# Patient Record
Sex: Male | Born: 1965 | Race: White | Hispanic: No | Marital: Single | Smoking: Former smoker
Health system: Southern US, Community
[De-identification: ages and names within clinical notes are randomized; demographics above are authoritative.]

## PROBLEM LIST (undated history)

## (undated) DIAGNOSIS — E119 Type 2 diabetes mellitus without complications: Secondary | ICD-10-CM

## (undated) HISTORY — DX: Type 2 diabetes mellitus without complications: E11.9

---

## 2006-07-17 ENCOUNTER — Emergency Department (HOSPITAL_COMMUNITY): Admission: EM | Admit: 2006-07-17 | Discharge: 2006-07-17 | Payer: Self-pay | Admitting: Family Medicine

## 2013-05-29 ENCOUNTER — Ambulatory Visit (INDEPENDENT_AMBULATORY_CARE_PROVIDER_SITE_OTHER): Payer: BC Managed Care – PPO | Admitting: Family Medicine

## 2013-05-29 VITALS — BP 130/86 | HR 88 | Temp 98.4°F | Resp 18 | Ht 65.5 in | Wt 182.0 lb

## 2013-05-29 DIAGNOSIS — H9319 Tinnitus, unspecified ear: Secondary | ICD-10-CM

## 2013-05-29 DIAGNOSIS — E119 Type 2 diabetes mellitus without complications: Secondary | ICD-10-CM

## 2013-05-29 LAB — GLUCOSE, POCT (MANUAL RESULT ENTRY): POC Glucose: 365 mg/dl — AB (ref 70–99)

## 2013-05-29 MED ORDER — METFORMIN HCL 1000 MG PO TABS: 1000.0000 mg | ORAL_TABLET | Freq: Two times a day (BID) | ORAL | Status: DC

## 2013-05-29 MED ORDER — GLIPIZIDE 5 MG PO TABS: 5.0000 mg | ORAL_TABLET | Freq: Two times a day (BID) | ORAL | Status: DC

## 2013-05-29 NOTE — Progress Notes (Signed)
Subjective:    Patient ID: Clayton Farrell, male    DOB: [birth year], 48 y.o.   MRN: [id number]  HPI Primary Physician: No primary provider on file.  Chief Complaint: Tinnitus right ear x 2 days  HPI: 48 y.o. male with history below presents with 2 day history of high pitched tinnitus in the right ear. Tinnitus is constant and does not pulsate. He states he is not sure or not if he feels like there is any associated right sided facial numbness and right sided headache. When I continue to ask him about this he states he cannot tell. He further states the numbness may be localized to the right ear as he has been pulling at the right ear for the past two days trying to get the ringing to stop. He has been placing hot water, hydrogen peroxide, and OTC cerumen remover into his ear canal without much success. 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 He does not use hearing protection when using this equipment. 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 No associated dizziness or vertigo symptoms. No congestion. Afebrile. No otalgia.    When I ask if his DM is controlled he answers with "yes, but not really." When I ask if he checks his blood sugars at home he states, "sometimes, but not really." Last time he checked he states they were "like 180." This was "like 6 months ago." His PCP is "different people." This list includes [facility] and a clinic on [address].      Past Medical History  Diagnosis Date  . Diabetes mellitus without complication      Home Meds: Prior to Admission medications   Medication Sig Start Date End Date Taking? Authorizing Provider  glipiZIDE (GLUCOTROL XL) 2.5 MG 24 hr tablet Take 2.5 mg by mouth daily with breakfast.   Yes Historical Provider, MD  metFORMIN (GLUCOPHAGE) 850 MG tablet Take 850 mg by mouth 2 (two) times daily with a meal.   Yes Historical Provider, MD    Allergies: No Known  Allergies  History   Social History  . Marital Status: Single    Spouse Name: N/A    Number of Children: N/A  . Years of Education: N/A   Occupational History  . Not on file.   Social History Main Topics  . Smoking status: Never Smoker   . Smokeless tobacco: Not on file  . Alcohol Use: No  . Drug Use: No  . Sexual Activity: Not on file   Other Topics Concern  . Not on file   Social History Narrative  . No narrative on file     Review of Systems  HENT: Positive for tinnitus. Negative for ear pain and hearing loss.        High pitched tinnitus.   Neurological: Positive for numbness and headaches. Negative for dizziness, speech difficulty and weakness.       Mild right sided HA.  He says he is not sure if the right side of his face his numb.        Objective:   Physical Exam  Physical Exam: Blood pressure 130/86, pulse 88, temperature 98.4 F (36.9 C), temperature source Oral, resp. rate 18, height 5' 5.5" (1.664 m), weight 182 lb (82.555 kg), SpO2 98.00%., Body mass index is 29.82 kg/(m^2). General: Well developed, well nourished, in no acute distress. Head: Normocephalic, atraumatic, eyes without discharge, sclera non-icteric, nares  are without discharge. Bilateral auditory canals clear, TM's are without perforation, pearly grey and translucent with reflective cone of light bilaterally. No pinna, tragus, or mastoid TTP bilaterally. Oral cavity moist, posterior pharynx without exudate, erythema, peritonsillar abscess, or post nasal drip. Uvula midline.   Neck: Supple. No thyromegaly. Full ROM. No lymphadenopathy. No nuchal rigidity. No carotid bruits bilaterally.  Lungs: Clear bilaterally to auscultation without wheezes, rales, or rhonchi. Breathing is unlabored. Heart: RRR with S1 S2. No murmurs, rubs, or gallops appreciated. Msk:  Strength and tone normal for age. Extremities/Skin: Warm and dry. No clubbing or cyanosis. No edema. No rashes or suspicious  lesions. Neuro: Alert and oriented X 3. Moves all extremities spontaneously. Gait is normal. CNII-XII grossly in tact. DTR: 1+ UE bilaterally and 0 LE bilaterally. Normal cerebellar function. Normal Rhomberg. Sensation equal along bilateral sides.  Psych:  Responds to questions appropriately with a normal affect.    Labs: Results for orders placed in visit on 05/29/13  GLUCOSE, POCT (MANUAL RESULT ENTRY)      Result Value Ref Range   POC Glucose 365 (*) 70 - 99 mg/dl       Assessment & Plan:  48 year old male with unilateral tinnitus and diabetes mellitus  1) Unilateral tinnitus -Nonfocal neuro exam -Trend blood sugars down -Recheck 24 hours -Plan for CT/MRI first of the week, sooner if needed -ER precautions given  2) Diabetes mellitus  -Stop prior DM meds -Metformin 1000 mg bid #60 no RF -Glipizide 5 mg bid #60 no RF -Healthy diet and exercise -Discussed risks of uncontrolled DM including death  Discussed with Dr. Serita GrammesGreene   Nimo Verastegui, MHS, PA-C [facility] [address] [place], [place] [address] [phone] [facility] 05/29/2013 5:15 PM

## 2013-05-30 ENCOUNTER — Ambulatory Visit (INDEPENDENT_AMBULATORY_CARE_PROVIDER_SITE_OTHER): Payer: BC Managed Care – PPO | Admitting: Family Medicine

## 2013-05-30 VITALS — BP 122/80 | HR 100 | Temp 97.6°F | Resp 15 | Ht 65.5 in | Wt 180.0 lb

## 2013-05-30 DIAGNOSIS — IMO0002 Reserved for concepts with insufficient information to code with codable children: Secondary | ICD-10-CM

## 2013-05-30 DIAGNOSIS — E1165 Type 2 diabetes mellitus with hyperglycemia: Secondary | ICD-10-CM | POA: Insufficient documentation

## 2013-05-30 DIAGNOSIS — H9319 Tinnitus, unspecified ear: Secondary | ICD-10-CM

## 2013-05-30 DIAGNOSIS — E118 Type 2 diabetes mellitus with unspecified complications: Principal | ICD-10-CM

## 2013-05-30 DIAGNOSIS — E119 Type 2 diabetes mellitus without complications: Secondary | ICD-10-CM | POA: Insufficient documentation

## 2013-05-30 LAB — POCT SEDIMENTATION RATE: POCT SED RATE: 7 mm/h (ref 0–22)

## 2013-05-30 LAB — POCT GLYCOSYLATED HEMOGLOBIN (HGB A1C): Hemoglobin A1C: 10

## 2013-05-30 LAB — GLUCOSE, POCT (MANUAL RESULT ENTRY): POC Glucose: 225 mg/dl — AB (ref 70–99)

## 2013-05-30 MED ORDER — GLYBURIDE 5 MG PO TABS: ORAL_TABLET | ORAL | Status: DC

## 2013-05-30 NOTE — Progress Notes (Signed)
Patient discussed with Eula Listenyan Dunn, PA-C. Agree with assessment and plan of care per his note.

## 2013-05-30 NOTE — Patient Instructions (Signed)
Increase to 5mg  of glyburide- take 2 of the 2.5 mg pills until you finish these.  Then you can go to the 5mg  tablet one a day.  We may increase to 10 mg in the future.  Please work on your diet and consume less sugar. Please come and see us in one month for a recheck.

## 2013-05-30 NOTE — Progress Notes (Addendum)
[facility] [address], [place] [address] [phone]  Date:  05/30/2013   Name:  Clayton Farrell   DOB:  May 14, 1965   MRN:  [id number]  PCP:  No primary provider on file.    Chief Complaint: Medication question, Tinnitus and Follow-up   History of Present Illness:  Clayton Farrell is a 48 y.o. very pleasant male patient who presents with the following:  Here today to recheck his uncontrolled diabetes.  He was seen yesterday with tinnitus in the right ear.  Was noted to have ben exposed to loud machinery recently for his job.  He was unclear on his current medication doses yesterday- we had planned to increase his metformin and start glipizide.  However it turns out he had been on glyburide 2.5 and had a full bottle of this at home.  He wonders if he can increase the dose of this instead of switching to glipizide Admits he has not been very compliant with his diet recently and has been eating a lot of sugar.  He plans to work on this.    There are no active problems to display for this patient.   Past Medical History  Diagnosis Date  . Diabetes mellitus without complication     History reviewed. No pertinent past surgical history.  History  Substance Use Topics  . Smoking status: Former Smoker    Quit date: 01/31/2008  . Smokeless tobacco: Not on file  . Alcohol Use: No    Family History  Problem Relation Age of Onset  . Diabetes Mother   . Diabetes Sister   . Diabetes Brother     No Known Allergies  Medication list has been reviewed and updated.  Current Outpatient Prescriptions on File Prior to Visit  Medication Sig Dispense Refill  . metFORMIN (GLUCOPHAGE) 1000 MG tablet Take 1 tablet (1,000 mg total) by mouth 2 (two) times daily with a meal.  60 tablet  0  . glipiZIDE (GLUCOTROL) 5 MG tablet Take 1 tablet (5 mg total) by mouth 2 (two) times daily before a meal.  60 tablet  0   No current facility-administered medications on file  prior to visit.    Review of Systems:  As per HPI- otherwise negative.   Physical Examination: Filed Vitals:   05/30/13 1507  BP: 122/80  Pulse: 100  Temp: 97.6 F (36.4 C)  Resp: 15   Filed Vitals:   05/30/13 1507  Height: 5' 5.5" (1.664 m)  Weight: 180 lb (81.647 kg)   Body mass index is 29.49 kg/(m^2). Ideal Body Weight: Weight in (lb) to have BMI = 25: 152.2  GEN: WDWN, NAD, Non-toxic, A & O x 3, looks well, overweight HEENT: Atraumatic, Normocephalic. Neck supple. No masses, No LAD.  Bilateral TM wnl, oropharynx normal.  PEERL,EOMI.   Ears and Nose: No external deformity. CV: RRR, No M/G/R. No JVD. No thrill. No extra heart sounds. PULM: CTA B, no wheezes, crackles, rhonchi. No retractions. No resp. distress. No accessory muscle use. ABD: S, NT, ND, +BS. No rebound. No HSM. EXTR: No c/c/e NEURO Normal gait.  PSYCH: Normally interactive. Conversant. Not depressed or anxious appearing.  Calm demeanor.   Results for orders placed in visit on 05/30/13  GLUCOSE, POCT (MANUAL RESULT ENTRY)      Result Value Ref Range   POC Glucose 225 (*) 70 - 99 mg/dl  POCT GLYCOSYLATED HEMOGLOBIN (HGB A1C)      Result Value Ref Range  Hemoglobin A1C 10      Assessment and Plan: Type II or unspecified type diabetes mellitus with unspecified complication, uncontrolled - Plan: POCT glucose (manual entry), POCT glycosylated hemoglobin (Hb A1C), Basic metabolic panel, glyBURIDE (DIABETA) 5 MG tablet  Tinnitus  Discussed his DM with him.  His A1c shows that he is not under good control. He can stick with glyburide as he already has this available- will go up to 5mg  and may go up further as needed.  He will work on his diet and try to reduce sugars. Plan recheck in 1 month.  He is concerned about his tinnitus- will check sed rate and BMP for him today, follow-up with las.  Plan ENT referral if this continues   Signed Abbe AmsterdamJessica Copland, MD  Called him back on 5/20: he states he already  went to see ENT and was told that perhaps his blood sugar was causing his tinnitus.  Agree that this is possible, and he will come and see us in a month to recheck.

## 2013-05-31 LAB — BASIC METABOLIC PANEL
BUN: 10 mg/dL (ref 6–23)
CO2: 22 meq/L (ref 19–32)
Calcium: 10.2 mg/dL (ref 8.4–10.5)
Chloride: 104 mEq/L (ref 96–112)
Creat: 0.78 mg/dL (ref 0.50–1.35)
GLUCOSE: 217 mg/dL — AB (ref 70–99)
Potassium: 4 mEq/L (ref 3.5–5.3)
Sodium: 138 mEq/L (ref 135–145)

## 2013-06-02 ENCOUNTER — Encounter: Payer: Self-pay | Admitting: Family Medicine

## 2013-06-25 ENCOUNTER — Ambulatory Visit (INDEPENDENT_AMBULATORY_CARE_PROVIDER_SITE_OTHER): Payer: BC Managed Care – PPO | Admitting: Family Medicine

## 2013-06-25 VITALS — BP 108/82 | HR 85 | Temp 98.3°F | Resp 16 | Ht 66.25 in | Wt 172.2 lb

## 2013-06-25 DIAGNOSIS — Z23 Encounter for immunization: Secondary | ICD-10-CM

## 2013-06-25 DIAGNOSIS — J3489 Other specified disorders of nose and nasal sinuses: Secondary | ICD-10-CM

## 2013-06-25 DIAGNOSIS — E119 Type 2 diabetes mellitus without complications: Secondary | ICD-10-CM

## 2013-06-25 DIAGNOSIS — E118 Type 2 diabetes mellitus with unspecified complications: Principal | ICD-10-CM

## 2013-06-25 DIAGNOSIS — R0981 Nasal congestion: Secondary | ICD-10-CM

## 2013-06-25 DIAGNOSIS — E1165 Type 2 diabetes mellitus with hyperglycemia: Secondary | ICD-10-CM

## 2013-06-25 DIAGNOSIS — IMO0002 Reserved for concepts with insufficient information to code with codable children: Secondary | ICD-10-CM

## 2013-06-25 LAB — GLUCOSE, POCT (MANUAL RESULT ENTRY): POC Glucose: 114 mg/dl — AB (ref 70–99)

## 2013-06-25 LAB — MICROALBUMIN, URINE: Microalb, Ur: 1.75 mg/dL (ref 0.00–1.89)

## 2013-06-25 MED ORDER — FLUTICASONE PROPIONATE 50 MCG/ACT NA SUSP: 2.0000 | Freq: Every day | NASAL | Status: DC

## 2013-06-25 MED ORDER — METFORMIN HCL 1000 MG PO TABS
1000.0000 mg | ORAL_TABLET | Freq: Two times a day (BID) | ORAL | Status: DC
Start: 2013-06-25 — End: 2015-03-20

## 2013-06-25 NOTE — Patient Instructions (Signed)
I will be in touch with your labs.  Your blood pressure looks good. Please come and see us in about 2 months to follow-up your diabetes.  Your blood sugar looks ok today as well.   Try the flonase for your nasal congestion For the time being stick with a 1/2 tablet of your glyburide medication  Office Visit on 05/30/2013  Component Date Value Ref Range Status  . POC Glucose 05/30/2013 225* 70 - 99 mg/dl Final  . Hemoglobin [id number] 05/30/2013 10   Final  . Sodium 05/30/2013 138  135 - 145 mEq/L Final  . Potassium 05/30/2013 4.0  3.5 - 5.3 mEq/L Final  . Chloride 05/30/2013 104  96 - 112 mEq/L Final  . CO2 05/30/2013 22  19 - 32 mEq/L Final  . Glucose, Bld 05/30/2013 217* 70 - 99 mg/dL Final  . BUN 09/60/454005/17/2015 10  6 - 23 mg/dL Final  . Creat 98/11/914705/17/2015 0.78  0.50 - 1.35 mg/dL Final  . Calcium 82/95/621305/17/2015 10.2  8.4 - 10.5 mg/dL Final

## 2013-06-25 NOTE — Progress Notes (Signed)
[facility] [address], [place] [address] [phone]  Date:  06/25/2013   Name:  Clayton ReddishJuan T Farrell   DOB:  07/06/1965   MRN:  [id number]  PCP:  No PCP Per Patient    Chief Complaint: Follow-up   History of Present Illness:  Clayton Farrell is a 48 y.o. very pleasant male patient who presents with the following:  Here today to recheck DM.  Seen here last month and was started on therapy for his DM which was undertreated at that time.  Noted to have an A1c of 10%. He is fasting today.  He has been checking his glucose at home- running 80-90s in the morning generally.  When he tried to take 5mg  of glyburide however he might get into he 60s/ 70s.   He has been watching his diet.   He has been taking metformin 1000 BID and glyburide 2.5 mg.   He still has the tinnitus- he did see ENT, but was told it was nothing dangerous but there is no likely treatment for him.  He is trying to use white noise for distraction.  His employer has changed his insurance and he will likely not have insurance by the end of this month. He does not think he will be able to pay for the new plan   Patient Active Problem List   Diagnosis Date Noted  . Type II or unspecified type diabetes mellitus with unspecified complication, uncontrolled 05/30/2013    Past Medical History  Diagnosis Date  . Diabetes mellitus without complication     No past surgical history on file.  History  Substance Use Topics  . Smoking status: Former Smoker    Quit date: 01/31/2008  . Smokeless tobacco: Not on file  . Alcohol Use: No    Family History  Problem Relation Age of Onset  . Diabetes Mother   . Diabetes Sister   . Diabetes Brother     No Known Allergies  Medication list has been reviewed and updated.  Current Outpatient Prescriptions on File Prior to Visit  Medication Sig Dispense Refill  . glyBURIDE (DIABETA) 5 MG tablet Take 1 daily. Increase as directed  60 tablet  4  .  metFORMIN (GLUCOPHAGE) 1000 MG tablet Take 1 tablet (1,000 mg total) by mouth 2 (two) times daily with a meal.  60 tablet  0   No current facility-administered medications on file prior to visit.    Review of Systems:  As per HPI- otherwise negative.   Physical Examination: Filed Vitals:   06/25/13 0924  BP: 108/82  Pulse: 85  Temp: 98.3 F (36.8 C)  Resp: 16   Filed Vitals:   06/25/13 0924  Height: 5' 6.25" (1.683 m)  Weight: 172 lb 3.2 oz (78.109 kg)   Body mass index is 27.58 kg/(m^2). Ideal Body Weight: Weight in (lb) to have BMI = 25: 155.7b  GEN: WDWN, NAD, Non-toxic, A & O x 3, looks well HEENT: Atraumatic, Normocephalic. Neck supple. No masses, No LAD. Ears and Nose: No external deformity. CV: RRR, No M/G/R. No JVD. No thrill. No extra heart sounds. PULM: CTA B, no wheezes, crackles, rhonchi. No retractions. No resp. distress. No accessory muscle use. EXTR: No c/c/e NEURO Normal gait.  PSYCH: Normally interactive. Conversant. Not depressed or anxious appearing.  Calm demeanor.  Foot exam today, normal  Results for orders placed in visit on 06/25/13  GLUCOSE, POCT (MANUAL RESULT ENTRY)  Result Value Ref Range   POC Glucose 114 (*) 70 - 99 mg/dl    Assessment and Plan: Type II or unspecified type diabetes mellitus with unspecified complication, uncontrolled - Plan: POCT glucose (manual entry), Comprehensive metabolic panel, Lipid panel, Microalbumin, urine, Pneumococcal polysaccharide vaccine 23-valent greater than or equal to 2yo subcutaneous/IM  Type II or unspecified type diabetes mellitus without mention of complication, not stated as uncontrolled - Plan: metFORMIN (GLUCOPHAGE) 1000 MG tablet  Nasal congestion - Plan: fluticasone (FLONASE) 50 MCG/ACT nasal spray  Encouraged him to see the eye doctor this month prior to his insurance running out. Will have him come back in 2 months.   Try flonase for his nasal sx He will stick with 2.5 mg of glyburide  for now as he seemed to have some lows with 5mg .    Signed Abbe AmsterdamJessica Borghild Thaker, MD

## 2013-06-26 ENCOUNTER — Encounter: Payer: Self-pay | Admitting: Family Medicine

## 2013-06-26 LAB — LIPID PANEL
Cholesterol: 118 mg/dL (ref 0–200)
HDL: 40 mg/dL (ref >39)
LDL Cholesterol: 68 mg/dL (ref 0–99)
TRIGLYCERIDES: 49 mg/dL (ref <150)
Total CHOL/HDL Ratio: 3 Ratio
VLDL: 10 mg/dL (ref 0–40)

## 2013-06-26 LAB — COMPREHENSIVE METABOLIC PANEL
ALBUMIN: 4.9 g/dL (ref 3.5–5.2)
ALK PHOS: 55 U/L (ref 39–117)
ALT: 71 U/L — ABNORMAL HIGH (ref 0–53)
AST: 34 U/L (ref 0–37)
BILIRUBIN TOTAL: 1 mg/dL (ref 0.2–1.2)
BUN: 12 mg/dL (ref 6–23)
CO2: 23 meq/L (ref 19–32)
Calcium: 9.5 mg/dL (ref 8.4–10.5)
Chloride: 107 mEq/L (ref 96–112)
Creat: 0.8 mg/dL (ref 0.50–1.35)
GLUCOSE: 113 mg/dL — AB (ref 70–99)
Potassium: 4.3 mEq/L (ref 3.5–5.3)
Sodium: 139 mEq/L (ref 135–145)
Total Protein: 7.2 g/dL (ref 6.0–8.3)

## 2015-03-20 ENCOUNTER — Ambulatory Visit (INDEPENDENT_AMBULATORY_CARE_PROVIDER_SITE_OTHER): Payer: Self-pay | Admitting: Family Medicine

## 2015-03-20 VITALS — BP 128/89 | HR 90 | Temp 98.1°F | Resp 19 | Ht 67.0 in | Wt 184.4 lb

## 2015-03-20 DIAGNOSIS — L237 Allergic contact dermatitis due to plants, except food: Secondary | ICD-10-CM

## 2015-03-20 DIAGNOSIS — E1165 Type 2 diabetes mellitus with hyperglycemia: Secondary | ICD-10-CM

## 2015-03-20 DIAGNOSIS — IMO0001 Reserved for inherently not codable concepts without codable children: Secondary | ICD-10-CM

## 2015-03-20 LAB — GLUCOSE, POCT (MANUAL RESULT ENTRY): POC GLUCOSE: 161 mg/dL — AB (ref 70–99)

## 2015-03-20 LAB — POCT GLYCOSYLATED HEMOGLOBIN (HGB A1C): Hemoglobin A1C: 11.8

## 2015-03-20 MED ORDER — METFORMIN HCL 1000 MG PO TABS: 1000.0000 mg | ORAL_TABLET | Freq: Two times a day (BID) | ORAL | Status: DC

## 2015-03-20 MED ORDER — TRIAMCINOLONE ACETONIDE 0.1 % EX CREA: 1.0000 "application " | TOPICAL_CREAM | Freq: Two times a day (BID) | CUTANEOUS | Status: DC

## 2015-03-20 MED ORDER — GLIPIZIDE 5 MG PO TABS: 5.0000 mg | ORAL_TABLET | Freq: Two times a day (BID) | ORAL | Status: DC

## 2015-03-20 NOTE — Progress Notes (Addendum)
Subjective:    Patient ID: Clayton Farrell, male    DOB: 1965/03/19, 50 y.o.   MRN: [id number] By signing my name below, I, Clayton Farrell, attest that this documentation has been prepared under the direction and in the presence of Clayton Ray, MD. Electronically Signed: Judithe Farrell, ER Scribe. 03/20/2015. 5:16 PM.  Chief Complaint  Patient presents with  . Medication Refill    metformin, glyburide  . Poison Ivy    HPI HPI Comments: Clayton Farrell is a 50 y.o. male who presents to [facility] complaining of possible poison ivy and a medication refill. He noticed the rash 11 days ago that stated on his left arm, but over the last couple of weeks he has been exposed on his right arm. Over the last week the rash has spread to his chest and scrotum, though he states the scrotal rash has been improving over the last couple of days. Over the last two days he noted some rash on his face. He urination has been normal. He put calamine lotion on it over the past couple of weeks.   His last DM check with a physician was 5 months ago. He has been taking metformin '1000mg'$  and glipizide '5mg'$ , both twice daily. He has not missed any doses of either medication. He checked his blood sugar two weeks ago.  DM: Lab Results  Component Value Date   HGBA1C 10 05/30/2013   Lab Results  Component Value Date   MICROALBUR 1.75 06/25/2013   No lab work here since 2015.    Patient Active Problem List   Diagnosis Date Noted  . Type II or unspecified type diabetes mellitus with unspecified complication, uncontrolled 05/30/2013   Past Medical History  Diagnosis Date  . Diabetes mellitus without complication ([place])    No past surgical history on file. No Known Allergies Prior to Admission medications   Medication Sig Start Date End Date Taking? Authorizing Provider  glyBURIDE (DIABETA) 5 MG tablet Take 1 daily. Increase as directed 05/30/13  Yes Gay Filler Copland, MD  metFORMIN (GLUCOPHAGE) 1000 MG tablet  Take 1 tablet (1,000 mg total) by mouth 2 (two) times daily with a meal. 06/25/13  Yes Darreld Mclean, MD   Social History   Social History  . Marital Status: Single    Spouse Name: N/A  . Number of Children: N/A  . Years of Education: N/A   Occupational History  . Not on file.   Social History Main Topics  . Smoking status: Former Smoker    Quit date: 01/31/2008  . Smokeless tobacco: Not on file  . Alcohol Use: No  . Drug Use: No  . Sexual Activity: Not on file   Other Topics Concern  . Not on file   Social History Narrative    Review of Systems  Constitutional: Negative for fever and chills.  Genitourinary: Negative for dysuria.  Neurological: Negative for dizziness, light-headedness and headaches.      Objective:  BP 128/89 mmHg  Pulse 90  Temp(Src) 98.1 F (36.7 C) (Oral)  Resp 19  Ht '5\' 7"'$  (1.702 m)  Wt 184 lb 6.4 oz (83.643 kg)  BMI 28.87 kg/m2  SpO2 98%  Physical Exam  Constitutional: He is oriented to person, place, and time. He appears well-developed and well-nourished. No distress.  HENT:  Head: Normocephalic and atraumatic.  Mouth/Throat: Oropharynx is clear and moist. No oropharyngeal exudate.  Mouth clear without oral lesions.  Eyes: Pupils are equal, round, and  reactive to light.  Neck: Neck supple.  Cardiovascular: Normal rate, regular rhythm and normal heart sounds.   No murmur heard. Pulmonary/Chest: Effort normal and breath sounds normal. No respiratory distress. He has no wheezes.  Lungs clear.  Musculoskeletal: Normal range of motion.  Neurological: He is alert and oriented to person, place, and time. Coordination normal.  Skin: Skin is warm and dry. He is not diaphoretic.  Few small erythematous patches above eyebrows. Left forearm has a few excoriated erythematous patches. Similar excoriated patches on right forearm as well as small red papules and a few lines of papules. On chest he has a few scattered patches of erythema. Few  erythematous patches at the undersurface of the penis.   Psychiatric: He has a normal mood and affect. His behavior is normal.  Nursing note and vitals reviewed.  Results for orders placed or performed in visit on 03/20/15  POCT glucose (manual entry)  Result Value Ref Range   POC Glucose 161 (A) 70 - 99 mg/dl  POCT glycosylated hemoglobin (Hb A1C)  Result Value Ref Range   Hemoglobin A1C 11.8        Assessment & Plan:   [name] is a 50 y.o. male Poison ivy dermatitis - Plan: triamcinolone cream (KENALOG) 0.1 %  -Start with triamcinolone cream to affected areas. Avoid eyes or genitals. Discussed possible need for prednisone, but with uncontrolled diabetes based on A1c, recommended against initial prednisone for now. If genital symptoms not continuing to improve, or increase in face symptoms/new areas on face, would consider prednisone at that time with close monitoring of glucose.  Uncontrolled type 2 diabetes mellitus without complication, without long-term current use of insulin ([place]) - Plan: POCT glucose (manual entry), POCT glycosylated hemoglobin (Hb A1C), glipiZIDE (GLUCOTROL) 5 MG tablet, metFORMIN (GLUCOPHAGE) 1000 MG tablet  -Uncontrolled by A1c, but point care glucose in office does not seem consistent with degree of elevation of A1c.  -We'll continue same doses of meds for now, but advised to follow-up in the next 3-4 weeks with home glucose readings to determine changes. Information on diabetes and standards of care given on after visit summary.  Meds ordered this encounter  Medications  . DISCONTD: glipiZIDE (GLUCOTROL) 5 MG tablet    Sig: Take 5 mg by mouth 2 (two) times daily.    Refill:  0  . glipiZIDE (GLUCOTROL) 5 MG tablet    Sig: Take 1 tablet (5 mg total) by mouth 2 (two) times daily.    Dispense:  180 tablet    Refill:  0  . metFORMIN (GLUCOPHAGE) 1000 MG tablet    Sig: Take 1 tablet (1,000 mg total) by mouth 2 (two) times daily with a meal.     Dispense:  180 tablet    Refill:  0  . triamcinolone cream (KENALOG) 0.1 %    Sig: Apply 1 application topically 2 (two) times daily.    Dispense:  30 g    Refill:  0   Patient Instructions  Your rash appears to be due to poison ivy. You can try the triamcinolone cream 2-3 times per day to the affected areas (avoid face and genitals).  If rash on face and genitals not improving in the next 2-3 days, you may need prednisone, but this can raise your blood sugars further.   Your 3 month average indicated diabetes is not controlled, but reading in office is only somewhat elevated. We can continue same doses of meds for now, but keep a  record of your blood sugars outside of the office twice per day and bring them to the next office visit in 3-4 weeks (here or elsewhere) to decide on what change is needed. Return to the clinic or go to the nearest emergency room if any of your symptoms worsen or new symptoms occur.  See recommended tests below for diabetes. Let us know if we can help you with these tests and if we are following your diabetes - recheck in next 3 months here or other care provider that is treating your diabetes.  I do recommend you establish care with one provider for your diabetes.   Poison [employer] ivy is a inflammation of the skin (contact dermatitis) caused by touching the allergens on the leaves of the ivy plant following previous exposure to the plant. The rash usually appears 48 hours after exposure. The rash is usually bumps (papules) or blisters (vesicles) in a linear pattern. Depending on your own sensitivity, the rash may simply cause redness and itching, or it may also progress to blisters which may break open. These must be well cared for to prevent secondary bacterial (germ) infection, followed by scarring. Keep any open areas dry, clean, dressed, and covered with an antibacterial ointment if needed. The eyes may also get puffy. The puffiness is worst in the morning and gets  better as the day progresses. This dermatitis usually heals without scarring, within 2 to 3 weeks without treatment. HOME CARE INSTRUCTIONS  Thoroughly wash with soap and water as soon as you have been exposed to poison ivy. You have about one half hour to remove the plant resin before it will cause the rash. This washing will destroy the oil or antigen on the skin that is causing, or will cause, the rash. Be sure to wash under your fingernails as any plant resin there will continue to spread the rash. Do not rub skin vigorously when washing affected area. Poison ivy cannot spread if no oil from the plant remains on your body. A rash that has progressed to weeping sores will not spread the rash unless you have not washed thoroughly. It is also important to wash any clothes you have been wearing as these may carry active allergens. The rash will return if you wear the unwashed clothing, even several days later. Avoidance of the plant in the future is the best measure. Poison ivy plant can be recognized by the number of leaves. Generally, poison ivy has three leaves with flowering branches on a single stem. Diphenhydramine may be purchased over the counter and used as needed for itching. Do not drive with this medication if it makes you drowsy.Ask your caregiver about medication for children. SEEK MEDICAL CARE IF:  Open sores develop.  Redness spreads beyond area of rash.  You notice purulent (pus-like) discharge.  You have increased pain.  Other signs of infection develop (such as fever).   This information is not intended to replace advice given to you by your health care provider. Make sure you discuss any questions you have with your health care provider.   Document Released: 12/29/1999 Document Revised: 03/25/2011 Document Reviewed: 06/08/2014 Elsevier Interactive Patient Education 2016 [employer].    Diabetes and Standards of Medical Care Diabetes is complicated. You may find that  your diabetes team includes a dietitian, nurse, diabetes educator, eye doctor, and more. To help everyone know what is going on and to help you get the care you deserve, the following schedule of care was developed  to help keep you on track. Below are the tests, exams, vaccines, medicines, education, and plans you will need. HbA1c test This test shows how well you have controlled your glucose over the past 2-3 months. It is used to see if your diabetes management plan needs to be adjusted.   It is performed at least 2 times a year if you are meeting treatment goals.  It is performed 4 times a year if therapy has changed or if you are not meeting treatment goals. Blood pressure test  This test is performed at every routine medical visit. The goal is less than 140/90 mm Hg for most people, but 130/80 mm Hg in some cases. Ask your health care provider about your goal. Dental exam  Follow up with the dentist regularly. Eye exam  If you are diagnosed with type 1 diabetes as a child, get an exam upon reaching the age of 10 years or older and having had diabetes for 3-5 years. Yearly eye exams are recommended after that initial eye exam.  If you are diagnosed with type 1 diabetes as an adult, get an exam within 5 years of diagnosis and then yearly.  If you are diagnosed with type 2 diabetes, get an exam as soon as possible after the diagnosis and then yearly. Foot care exam  Visual foot exams are performed at every routine medical visit. The exams check for cuts, injuries, or other problems with the feet.  You should have a complete foot exam performed every year. This exam includes an inspection of the structure and skin of your feet, a check of the pulses in your feet, and a check of the sensation in your feet.  Type 1 diabetes: The first exam is performed 5 years after diagnosis.  Type 2 diabetes: The first exam is performed at the time of diagnosis.  Check your feet nightly for cuts,  injuries, or other problems with your feet. Tell your health care provider if anything is not healing. Kidney function test (urine microalbumin)  This test is performed once a year.  Type 1 diabetes: The first test is performed 5 years after diagnosis.  Type 2 diabetes: The first test is performed at the time of diagnosis.  A serum creatinine and estimated glomerular filtration rate (eGFR) test is done once a year to assess the level of chronic kidney disease (CKD), if present. Lipid profile (cholesterol, HDL, LDL, triglycerides)  Performed every 5 years for most people.  The goal for LDL is less than 100 mg/dL. If you are at high risk, the goal is less than 70 mg/dL.  The goal for HDL is 40 mg/dL-50 mg/dL for men and 50 BN/ZO-87 mg/dL for women. An HDL cholesterol of 60 mg/dL or higher gives some protection against heart disease.  The goal for triglycerides is less than 150 mg/dL. Immunizations  The flu (influenza) vaccine is recommended yearly for every person 63 months of age or older who has diabetes.  The pneumonia (pneumococcal) vaccine is recommended for every person 35 years of age or older who has diabetes. Adults 82 years of age or older may receive the pneumonia vaccine as a series of two separate shots.  The hepatitis B vaccine is recommended for adults shortly after they have been diagnosed with diabetes.  The Tdap (tetanus, diphtheria, and pertussis) vaccine should be given:  According to normal childhood vaccination schedules, for children.  Every 10 years, for adults who have diabetes. Diabetes self-management education  Education is recommended at  diagnosis and ongoing as needed. Treatment plan  Your treatment plan is reviewed at every medical visit.   This information is not intended to replace advice given to you by your health care provider. Make sure you discuss any questions you have with your health care provider.   Document Released: 10/28/2008 Document  Revised: 01/21/2014 Document Reviewed: 06/02/2012 Elsevier Interactive Patient Education 2016 [employer].  Tips for Eating Away From Home If You Have Diabetes Controlling your level of blood glucose, also known as blood sugar, can be challenging. It can be even more difficult when you do not prepare your own meals. The following tips can help you manage your diabetes when you eat away from home. PLANNING AHEAD Plan ahead if you know you will be eating away from home:  Ask your health care provider how to time meals and medicine if you are taking insulin.  Make a list of restaurants near you that offer healthy choices. If they have a carry-out menu, take it home and plan what you will order ahead of time.  Look up the restaurant you want to eat at online. Many chain and fast-food restaurants list nutritional information online. Use this information to choose the healthiest options and to calculate how many carbohydrates will be in your meal.  Use a carbohydrate-counting book or mobile app to look up the carbohydrate content and serving size of the foods you want to eat.  Become familiar with serving sizes and learn to recognize how many servings are in a portion. This will allow you to estimate how many carbohydrates you can eat. FREE FOODS A "free food" is any food or drink that has less than 5 g of carbohydrates per serving. Free foods include:  Many vegetables.  Hard boiled eggs.  Nuts or seeds.  Olives.  Cheeses.  Meats. These types of foods make good appetizer choices and are often available at salad bars. Lemon juice, vinegar, or a low-calorie salad dressing of fewer than 20 calories per serving can be used as a "free" salad dressing.  CHOICES TO REDUCE CARBOHYDRATES  Substitute nonfat sweetened yogurt with a sugar-free yogurt. Yogurt made from soy milk may also be used, but you will still want a sugar-free or plain option to choose a lower carbohydrate amount.  Ask your  server to take away the bread basket or chips from your table.  Order fresh fruit. A salad bar often offers fresh fruit choices. Avoid canned fruit because it is usually packed in sugar or syrup.  Order a salad, and eat it without dressing. Or, create a "free" salad dressing.  Ask for substitutions. For example, instead of [place] fries, request an order of a vegetable such as salad, green beans, or broccoli. OTHER TIPS   If you take insulin, take the insulin once your food arrives to your table. This will ensure your insulin and food are timed correctly.  Ask your server about the portion size before your order, and ask for a take-out box if the portion has more servings than you should have. When your food comes, leave the amount you should have on the plate, and put the rest in the take-out box.  Consider splitting an entree with someone and ordering a side salad.   This information is not intended to replace advice given to you by your health care provider. Make sure you discuss any questions you have with your health care provider.   Document Released: 12/31/2004 Document Revised: 09/21/2014 Document Reviewed: 03/30/2013 Elsevier  Interactive Patient Education [employer].       I personally performed the services described in this documentation, which was scribed in my presence. The recorded information has been reviewed and considered, and addended by me as needed.

## 2015-03-20 NOTE — Patient Instructions (Addendum)
Your rash appears to be due to poison ivy. You can try the triamcinolone cream 2-3 times per day to the affected areas (avoid face and genitals).  If rash on face and genitals not improving in the next 2-3 days, you may need prednisone, but this can raise your blood sugars further.   Your 3 month average indicated diabetes is not controlled, but reading in office is only somewhat elevated. We can continue same doses of meds for now, but keep a record of your blood sugars outside of the office twice per day and bring them to the next office visit in 3-4 weeks (here or elsewhere) to decide on what change is needed. Return to the clinic or go to the nearest emergency room if any of your symptoms worsen or new symptoms occur.  See recommended tests below for diabetes. Let us know if we can help you with these tests and if we are following your diabetes - recheck in next 3 months here or other care provider that is treating your diabetes.  I do recommend you establish care with one provider for your diabetes.   Poison [employer] ivy is a inflammation of the skin (contact dermatitis) caused by touching the allergens on the leaves of the ivy plant following previous exposure to the plant. The rash usually appears 48 hours after exposure. The rash is usually bumps (papules) or blisters (vesicles) in a linear pattern. Depending on your own sensitivity, the rash may simply cause redness and itching, or it may also progress to blisters which may break open. These must be well cared for to prevent secondary bacterial (germ) infection, followed by scarring. Keep any open areas dry, clean, dressed, and covered with an antibacterial ointment if needed. The eyes may also get puffy. The puffiness is worst in the morning and gets better as the day progresses. This dermatitis usually heals without scarring, within 2 to 3 weeks without treatment. HOME CARE INSTRUCTIONS  Thoroughly wash with soap and water as soon as you have  been exposed to poison ivy. You have about one half hour to remove the plant resin before it will cause the rash. This washing will destroy the oil or antigen on the skin that is causing, or will cause, the rash. Be sure to wash under your fingernails as any plant resin there will continue to spread the rash. Do not rub skin vigorously when washing affected area. Poison ivy cannot spread if no oil from the plant remains on your body. A rash that has progressed to weeping sores will not spread the rash unless you have not washed thoroughly. It is also important to wash any clothes you have been wearing as these may carry active allergens. The rash will return if you wear the unwashed clothing, even several days later. Avoidance of the plant in the future is the best measure. Poison ivy plant can be recognized by the number of leaves. Generally, poison ivy has three leaves with flowering branches on a single stem. Diphenhydramine may be purchased over the counter and used as needed for itching. Do not drive with this medication if it makes you drowsy.Ask your caregiver about medication for children. SEEK MEDICAL CARE IF:  Open sores develop.  Redness spreads beyond area of rash.  You notice purulent (pus-like) discharge.  You have increased pain.  Other signs of infection develop (such as fever).   This information is not intended to replace advice given to you by your health care provider. Make  sure you discuss any questions you have with your health care provider.   Document Released: 12/29/1999 Document Revised: 03/25/2011 Document Reviewed: 06/08/2014 Elsevier Interactive Patient Education 2016 [place].    Diabetes and Standards of Medical Care Diabetes is complicated. You may find that your diabetes team includes a dietitian, nurse, diabetes educator, eye doctor, and more. To help everyone know what is going on and to help you get the care you deserve, the following schedule of care  was developed to help keep you on track. Below are the tests, exams, vaccines, medicines, education, and plans you will need. HbA1c test This test shows how well you have controlled your glucose over the past 2-3 months. It is used to see if your diabetes management plan needs to be adjusted.   It is performed at least 2 times a year if you are meeting treatment goals.  It is performed 4 times a year if therapy has changed or if you are not meeting treatment goals. Blood pressure test  This test is performed at every routine medical visit. The goal is less than 140/90 mm Hg for most people, but 130/80 mm Hg in some cases. Ask your health care provider about your goal. Dental exam  Follow up with the dentist regularly. Eye exam  If you are diagnosed with type 1 diabetes as a child, get an exam upon reaching the age of 34 years or older and having had diabetes for 3-5 years. Yearly eye exams are recommended after that initial eye exam.  If you are diagnosed with type 1 diabetes as an adult, get an exam within 5 years of diagnosis and then yearly.  If you are diagnosed with type 2 diabetes, get an exam as soon as possible after the diagnosis and then yearly. Foot care exam  Visual foot exams are performed at every routine medical visit. The exams check for cuts, injuries, or other problems with the feet.  You should have a complete foot exam performed every year. This exam includes an inspection of the structure and skin of your feet, a check of the pulses in your feet, and a check of the sensation in your feet.  Type 1 diabetes: The first exam is performed 5 years after diagnosis.  Type 2 diabetes: The first exam is performed at the time of diagnosis.  Check your feet nightly for cuts, injuries, or other problems with your feet. Tell your health care provider if anything is not healing. Kidney function test (urine microalbumin)  This test is performed once a year.  Type 1 diabetes:  The first test is performed 5 years after diagnosis.  Type 2 diabetes: The first test is performed at the time of diagnosis.  A serum creatinine and estimated glomerular filtration rate (eGFR) test is done once a year to assess the level of chronic kidney disease (CKD), if present. Lipid profile (cholesterol, HDL, LDL, triglycerides)  Performed every 5 years for most people.  The goal for LDL is less than 100 mg/dL. If you are at high risk, the goal is less than 70 mg/dL.  The goal for HDL is 40 mg/dL-50 mg/dL for men and 50 mg/dL-60 mg/dL for women. An HDL cholesterol of 60 mg/dL or higher gives some protection against heart disease.  The goal for triglycerides is less than 150 mg/dL. Immunizations  The flu (influenza) vaccine is recommended yearly for every person 74 months of age or older who has diabetes.  The pneumonia (pneumococcal) vaccine is recommended for  every person 29 years of age or older who has diabetes. Adults 85 years of age or older may receive the pneumonia vaccine as a series of two separate shots.  The hepatitis B vaccine is recommended for adults shortly after they have been diagnosed with diabetes.  The Tdap (tetanus, diphtheria, and pertussis) vaccine should be given:  According to normal childhood vaccination schedules, for children.  Every 10 years, for adults who have diabetes. Diabetes self-management education  Education is recommended at diagnosis and ongoing as needed. Treatment plan  Your treatment plan is reviewed at every medical visit.   This information is not intended to replace advice given to you by your health care provider. Make sure you discuss any questions you have with your health care provider.   Document Released: 10/28/2008 Document Revised: 01/21/2014 Document Reviewed: 06/02/2012 Elsevier Interactive Patient Education 2016 [place] for Eating Away From Home If You Have Diabetes Controlling your level of blood  glucose, also known as blood sugar, can be challenging. It can be even more difficult when you do not prepare your own meals. The following tips can help you manage your diabetes when you eat away from home. PLANNING AHEAD Plan ahead if you know you will be eating away from home:  Ask your health care provider how to time meals and medicine if you are taking insulin.  Make a list of restaurants near you that offer healthy choices. If they have a carry-out menu, take it home and plan what you will order ahead of time.  Look up the restaurant you want to eat at online. Many chain and fast-food restaurants list nutritional information online. Use this information to choose the healthiest options and to calculate how many carbohydrates will be in your meal.  Use a carbohydrate-counting book or mobile app to look up the carbohydrate content and serving size of the foods you want to eat.  Become familiar with serving sizes and learn to recognize how many servings are in a portion. This will allow you to estimate how many carbohydrates you can eat. FREE FOODS A "free food" is any food or drink that has less than 5 g of carbohydrates per serving. Free foods include:  Many vegetables.  Hard boiled eggs.  Nuts or seeds.  Olives.  Cheeses.  Meats. These types of foods make good appetizer choices and are often available at salad bars. Lemon juice, vinegar, or a low-calorie salad dressing of fewer than 20 calories per serving can be used as a "free" salad dressing.  CHOICES TO REDUCE CARBOHYDRATES  Substitute nonfat sweetened yogurt with a sugar-free yogurt. Yogurt made from soy milk may also be used, but you will still want a sugar-free or plain option to choose a lower carbohydrate amount.  Ask your server to take away the bread basket or chips from your table.  Order fresh fruit. A salad bar often offers fresh fruit choices. Avoid canned fruit because it is usually packed in sugar or  syrup.  Order a salad, and eat it without dressing. Or, create a "free" salad dressing.  Ask for substitutions. For example, instead of [place] fries, request an order of a vegetable such as salad, green beans, or broccoli. OTHER TIPS   If you take insulin, take the insulin once your food arrives to your table. This will ensure your insulin and food are timed correctly.  Ask your server about the portion size before your order, and ask for a take-out box if the  portion has more servings than you should have. When your food comes, leave the amount you should have on the plate, and put the rest in the take-out box.  Consider splitting an entree with someone and ordering a side salad.   This information is not intended to replace advice given to you by your health care provider. Make sure you discuss any questions you have with your health care provider.   Document Released: 12/31/2004 Document Revised: 09/21/2014 Document Reviewed: 03/30/2013 Elsevier Interactive Patient Education [employer].

## 2015-03-24 ENCOUNTER — Other Ambulatory Visit: Payer: Self-pay | Admitting: Family Medicine

## 2015-07-30 ENCOUNTER — Other Ambulatory Visit: Payer: Self-pay | Admitting: Family Medicine

## 2015-07-31 ENCOUNTER — Other Ambulatory Visit: Payer: Self-pay | Admitting: *Deleted

## 2015-07-31 DIAGNOSIS — E1165 Type 2 diabetes mellitus with hyperglycemia: Principal | ICD-10-CM

## 2015-07-31 DIAGNOSIS — IMO0001 Reserved for inherently not codable concepts without codable children: Secondary | ICD-10-CM

## 2015-07-31 MED ORDER — GLIPIZIDE 5 MG PO TABS: 5.0000 mg | ORAL_TABLET | Freq: Two times a day (BID) | ORAL | Status: DC

## 2015-12-22 ENCOUNTER — Ambulatory Visit (INDEPENDENT_AMBULATORY_CARE_PROVIDER_SITE_OTHER): Payer: PRIVATE HEALTH INSURANCE

## 2015-12-22 ENCOUNTER — Encounter (HOSPITAL_COMMUNITY): Payer: Self-pay | Admitting: *Deleted

## 2015-12-22 ENCOUNTER — Ambulatory Visit (HOSPITAL_COMMUNITY)
Admission: EM | Admit: 2015-12-22 | Discharge: 2015-12-22 | Disposition: A | Discharge status: Home / Self Care | Payer: PRIVATE HEALTH INSURANCE | Attending: Family Medicine | Admitting: Family Medicine

## 2015-12-22 DIAGNOSIS — M5030 Other cervical disc degeneration, unspecified cervical region: Secondary | ICD-10-CM

## 2015-12-22 DIAGNOSIS — M79641 Pain in right hand: Secondary | ICD-10-CM | POA: Diagnosis not present

## 2015-12-22 DIAGNOSIS — M542 Cervicalgia: Secondary | ICD-10-CM

## 2015-12-22 DIAGNOSIS — M436 Torticollis: Secondary | ICD-10-CM

## 2015-12-22 DIAGNOSIS — S161XXA Strain of muscle, fascia and tendon at neck level, initial encounter: Secondary | ICD-10-CM

## 2015-12-22 MED ORDER — MELOXICAM 15 MG PO TABS: 15.0000 mg | ORAL_TABLET | Freq: Every day | ORAL | 0 refills | Status: DC

## 2015-12-22 MED ORDER — CYCLOBENZAPRINE HCL 10 MG PO TABS: 10.0000 mg | ORAL_TABLET | Freq: Two times a day (BID) | ORAL | 0 refills | Status: DC | PRN

## 2015-12-22 MED ORDER — IBUPROFEN 800 MG PO TABS
800.0000 mg | ORAL_TABLET | Freq: Once | ORAL | Status: AC
  Administered 2015-12-22: 800 mg via ORAL

## 2015-12-22 MED ORDER — IBUPROFEN 800 MG PO TABS
ORAL_TABLET | ORAL | Status: AC
  Filled 2015-12-22: qty 1
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
## 2015-12-22 NOTE — Discharge Instructions (Signed)
°  Flexeril is a muscle relaxer and may cause drowsiness. Do not drink alcohol, drive, or operate heavy machinery while taking. ° °Meloxicam (Mobic) is an antiinflammatory to help with pain and inflammation.  Do not take ibuprofen, Advil, Aleve, or any other medications that contain NSAIDs while taking meloxicam as this may cause stomach upset or even ulcers if taken in large amounts for an extended period of time.  ° °
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
## 2016-01-13 ENCOUNTER — Encounter (HOSPITAL_COMMUNITY): Payer: Self-pay | Admitting: *Deleted

## 2016-01-13 ENCOUNTER — Ambulatory Visit (HOSPITAL_COMMUNITY)
Admission: EM | Admit: 2016-01-13 | Discharge: 2016-01-13 | Disposition: A | Discharge status: Home / Self Care | Payer: Self-pay | Attending: Emergency Medicine | Admitting: Emergency Medicine

## 2016-01-13 DIAGNOSIS — M654 Radial styloid tenosynovitis [de Quervain]: Secondary | ICD-10-CM

## 2016-01-13 MED ORDER — PREDNISONE 50 MG PO TABS: ORAL_TABLET | ORAL | 0 refills | Status: DC

## 2016-01-13 MED ORDER — MELOXICAM 15 MG PO TABS: 15.0000 mg | ORAL_TABLET | Freq: Every day | ORAL | 0 refills | Status: DC

## 2016-01-13 MED ORDER — FAMOTIDINE 20 MG PO TABS
ORAL_TABLET | ORAL | Status: AC
  Filled 2016-01-13: qty 1
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
## 2016-01-13 NOTE — Discharge Instructions (Signed)
You have something called De Quervain's tenosynovitis. This is inflammation of the tendons of the thumb. Wear the splint for the next week pretty much all the time. After that, you can start to come out of it more often. Take prednisone daily for 5 days. Take meloxicam daily for 1 week, then as needed for pain. This should gradually improve over the next 2 weeks. Follow-up as needed.
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
## 2016-03-02 ENCOUNTER — Encounter (HOSPITAL_COMMUNITY): Payer: Self-pay | Admitting: *Deleted

## 2016-03-02 ENCOUNTER — Ambulatory Visit (HOSPITAL_COMMUNITY)
Admission: EM | Admit: 2016-03-02 | Discharge: 2016-03-02 | Disposition: A | Discharge status: Home / Self Care | Payer: PRIVATE HEALTH INSURANCE | Attending: Family Medicine | Admitting: Family Medicine

## 2016-03-02 DIAGNOSIS — M79641 Pain in right hand: Secondary | ICD-10-CM

## 2016-03-02 NOTE — Discharge Instructions (Signed)
Call to see hand specialist for futher eval.

## 2016-03-02 NOTE — ED Notes (Signed)
Went over d/c instructions [place]/pt in [place] and adv him to f/u w/hand specialist.... Pt verb understanding.
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
## 2016-03-02 NOTE — ED Notes (Signed)
Plan    Of care  Discussed   With pt

## 2016-11-08 ENCOUNTER — Other Ambulatory Visit: Payer: Self-pay | Admitting: Nurse Practitioner

## 2016-11-08 DIAGNOSIS — M79641 Pain in right hand: Secondary | ICD-10-CM

## 2016-11-16 ENCOUNTER — Ambulatory Visit
Admission: RE | Admit: 2016-11-16 | Discharge: 2016-11-16 | Disposition: A | Discharge status: Home / Self Care | Payer: No Typology Code available for payment source | Source: Ambulatory Visit | Attending: Nurse Practitioner | Admitting: Nurse Practitioner

## 2016-11-16 DIAGNOSIS — M79641 Pain in right hand: Secondary | ICD-10-CM

## 2020-05-07 ENCOUNTER — Emergency Department (HOSPITAL_COMMUNITY): Payer: No Typology Code available for payment source

## 2020-05-07 ENCOUNTER — Emergency Department (HOSPITAL_COMMUNITY)
Admission: EM | Admit: 2020-05-07 | Discharge: 2020-05-07 | Disposition: A | Discharge status: Home / Self Care | Payer: No Typology Code available for payment source | Attending: Emergency Medicine | Admitting: Emergency Medicine

## 2020-05-07 DIAGNOSIS — Z87891 Personal history of nicotine dependence: Secondary | ICD-10-CM | POA: Insufficient documentation

## 2020-05-07 DIAGNOSIS — Z7984 Long term (current) use of oral hypoglycemic drugs: Secondary | ICD-10-CM | POA: Insufficient documentation

## 2020-05-07 DIAGNOSIS — E1165 Type 2 diabetes mellitus with hyperglycemia: Secondary | ICD-10-CM | POA: Insufficient documentation

## 2020-05-07 DIAGNOSIS — G43809 Other migraine, not intractable, without status migrainosus: Secondary | ICD-10-CM | POA: Insufficient documentation

## 2020-05-07 DIAGNOSIS — Z79899 Other long term (current) drug therapy: Secondary | ICD-10-CM | POA: Insufficient documentation

## 2020-05-07 DIAGNOSIS — R739 Hyperglycemia, unspecified: Secondary | ICD-10-CM

## 2020-05-07 DIAGNOSIS — I10 Essential (primary) hypertension: Secondary | ICD-10-CM | POA: Insufficient documentation

## 2020-05-07 DIAGNOSIS — Z20822 Contact with and (suspected) exposure to covid-19: Secondary | ICD-10-CM | POA: Insufficient documentation

## 2020-05-07 DIAGNOSIS — G43801 Other migraine, not intractable, with status migrainosus: Secondary | ICD-10-CM

## 2020-05-07 LAB — DIFFERENTIAL
Abs Immature Granulocytes: 0.03 10*3/uL (ref 0.00–0.07)
Basophils Absolute: 0.1 10*3/uL (ref 0.0–0.1)
Basophils Relative: 0 %
Eosinophils Absolute: 0.1 10*3/uL (ref 0.0–0.5)
Eosinophils Relative: 1 %
Immature Granulocytes: 0 %
Lymphocytes Relative: 16 %
Lymphs Abs: 1.8 10*3/uL (ref 0.7–4.0)
Monocytes Absolute: 0.8 10*3/uL (ref 0.1–1.0)
Monocytes Relative: 7 %
Neutro Abs: 8.7 10*3/uL — ABNORMAL HIGH (ref 1.7–7.7)
Neutrophils Relative %: 76 %

## 2020-05-07 LAB — COMPREHENSIVE METABOLIC PANEL
ALT: 18 U/L (ref 0–44)
AST: 12 U/L — ABNORMAL LOW (ref 15–41)
Albumin: 4.2 g/dL (ref 3.5–5.0)
Alkaline Phosphatase: 77 U/L (ref 38–126)
Anion gap: 12 (ref 5–15)
BUN: 11 mg/dL (ref 6–20)
CO2: 23 mmol/L (ref 22–32)
Calcium: 9.9 mg/dL (ref 8.9–10.3)
Chloride: 101 mmol/L (ref 98–111)
Creatinine, Ser: 0.75 mg/dL (ref 0.61–1.24)
GFR, Estimated: >60 mL/min (ref >60)
Glucose, Bld: 304 mg/dL — ABNORMAL HIGH (ref 70–99)
Potassium: 4.1 mmol/L (ref 3.5–5.1)
Sodium: 136 mmol/L (ref 135–145)
Total Bilirubin: 1.6 mg/dL — ABNORMAL HIGH (ref 0.3–1.2)
Total Protein: 7.5 g/dL (ref 6.5–8.1)

## 2020-05-07 LAB — URINALYSIS, ROUTINE W REFLEX MICROSCOPIC
Bacteria, UA: NONE SEEN
Bilirubin Urine: NEGATIVE
Glucose, UA: >=500 mg/dL — AB
Hgb urine dipstick: NEGATIVE
Ketones, ur: 20 mg/dL — AB
Leukocytes,Ua: NEGATIVE
Nitrite: NEGATIVE
Protein, ur: NEGATIVE mg/dL
Specific Gravity, Urine: 1.015 (ref 1.005–1.030)
pH: 6 (ref 5.0–8.0)

## 2020-05-07 LAB — PROTIME-INR
INR: 1.1 (ref 0.8–1.2)
Prothrombin Time: 13.9 seconds (ref 11.4–15.2)

## 2020-05-07 LAB — APTT: aPTT: 27 seconds (ref 24–36)

## 2020-05-07 LAB — CBC
HCT: 46.8 % (ref 39.0–52.0)
Hemoglobin: 16.5 g/dL (ref 13.0–17.0)
MCH: 31.1 pg (ref 26.0–34.0)
MCHC: 35.3 g/dL (ref 30.0–36.0)
MCV: 88.1 fL (ref 80.0–100.0)
Platelets: 266 10*3/uL (ref 150–400)
RBC: 5.31 MIL/uL (ref 4.22–5.81)
RDW: 11.5 % (ref 11.5–15.5)
WBC: 11.4 10*3/uL — ABNORMAL HIGH (ref 4.0–10.5)
nRBC: 0 % (ref 0.0–0.2)

## 2020-05-07 LAB — RAPID URINE DRUG SCREEN, HOSP PERFORMED
Amphetamines: NOT DETECTED
Barbiturates: NOT DETECTED
Benzodiazepines: NOT DETECTED
Cocaine: NOT DETECTED
Opiates: NOT DETECTED
Tetrahydrocannabinol: NOT DETECTED

## 2020-05-07 LAB — ETHANOL: Alcohol, Ethyl (B): <10 mg/dL (ref <10)

## 2020-05-07 LAB — RESP PANEL BY RT-PCR (FLU A&B, COVID) ARPGX2
Influenza A by PCR: NEGATIVE
Influenza B by PCR: NEGATIVE
SARS Coronavirus 2 by RT PCR: NEGATIVE

## 2020-05-07 MED ORDER — DIPHENHYDRAMINE HCL 50 MG/ML IJ SOLN
12.5000 mg | Freq: Once | INTRAMUSCULAR | Status: AC
  Administered 2020-05-07: 12.5 mg via INTRAVENOUS
  Filled 2020-05-07: qty 1

## 2020-05-07 MED ORDER — BUTALBITAL-APAP-CAFFEINE 50-325-40 MG PO TABS: 1.0000 | ORAL_TABLET | Freq: Four times a day (QID) | ORAL | 0 refills | Status: AC | PRN

## 2020-05-07 MED ORDER — ONDANSETRON 8 MG PO TBDP: 8.0000 mg | ORAL_TABLET | Freq: Three times a day (TID) | ORAL | 0 refills | Status: DC | PRN

## 2020-05-07 MED ORDER — PROCHLORPERAZINE EDISYLATE 10 MG/2ML IJ SOLN
10.0000 mg | Freq: Once | INTRAMUSCULAR | Status: AC
  Administered 2020-05-07: 10 mg via INTRAVENOUS
  Filled 2020-05-07: qty 2

## 2020-05-07 MED ORDER — KETOROLAC TROMETHAMINE 30 MG/ML IJ SOLN
30.0000 mg | Freq: Once | INTRAMUSCULAR | Status: AC
  Administered 2020-05-07: 30 mg via INTRAVENOUS
  Filled 2020-05-07: qty 1

## 2020-05-07 MED ORDER — MORPHINE SULFATE (PF) 4 MG/ML IV SOLN
4.0000 mg | Freq: Once | INTRAVENOUS | Status: AC
Start: 2020-05-07 — End: 2020-05-07
  Administered 2020-05-07: 4 mg via INTRAVENOUS
  Filled 2020-05-07: qty 1

## 2020-05-07 NOTE — Discharge Instructions (Addendum)
Take the medications as needed for your headache.  Continue your medications for your blood pressure.  Follow-up with your doctor next week to be rechecked.  Return to the ED as needed for worsening symptoms or if not improving in the next few days

## 2020-05-07 NOTE — ED Provider Notes (Signed)
[facility] EMERGENCY DEPARTMENT Provider Note   CSN: [id number] Arrival date & time: 05/07/20  0909    Spanish language translator used History Headache, dizziness: Chief complaint  Clayton Farrell is a 55 y.o. male.  HPI   Patient states he has not been feeling well for at least a week or 2.  He has had some generalized malaise.  He has been having some headache.  He has been feeling lightheaded.  Patient has not had much of an appetite.  He has not had any issues with fevers.  He has not measured his temperature.  He is not having any coughing.  He is not having any vomiting or diarrhea.  He denies any chest pain or abdominal pain.  Patient states he saw a primary care doctor.  He has been diagnosed with diabetes and hypertension and has been taking medications for that.  Today when he woke up he had increasing headache.  He is concerned that he might be having a stroke.  He is not having any trouble with his balance or coordination.  He is able to walk without difficulty.  No trouble with his speech.  Past Medical History:  Diagnosis Date  . Diabetes mellitus without complication [facility])     Patient Active Problem List   Diagnosis Date Noted  . Type II or unspecified type diabetes mellitus with unspecified complication, uncontrolled 05/30/2013    No past surgical history on file.     Family History  Problem Relation Age of Onset  . Diabetes Mother   . Diabetes Sister   . Diabetes Brother     Social History   Tobacco Use  . Smoking status: Former Smoker    Quit date: 01/31/2008    Years since quitting: 12.2  . Smokeless tobacco: Never Used  Substance Use Topics  . Alcohol use: No  . Drug use: No    Home Medications Prior to Admission medications   Medication Sig Start Date End Date Taking? Authorizing Provider  butalbital-acetaminophen-caffeine (FIORICET) 50-325-40 MG tablet Take 1-2 tablets by mouth every 6 (six) hours as needed for headache.  05/07/20 05/07/21 Yes Linwood Dibbles, MD  ondansetron (ZOFRAN ODT) 8 MG disintegrating tablet Take 1 tablet (8 mg total) by mouth every 8 (eight) hours as needed for nausea or vomiting. 05/07/20  Yes Linwood Dibbles, MD  glipiZIDE (GLUCOTROL XL) 5 MG 24 hr tablet Take 5 mg by mouth daily. 05/01/20   [provider]  glipiZIDE (GLUCOTROL) 5 MG tablet Take 1 tablet (5 mg total) by mouth 2 (two) times daily. Needs office visit 07/31/15   Shade Flood, MD  lisinopril (ZESTRIL) 20 MG tablet Take 20 mg by mouth daily. 05/01/20   [provider]  meloxicam (MOBIC) 15 MG tablet Take 1 tablet (15 mg total) by mouth daily. 01/13/16   Charm Rings, MD  metFORMIN (GLUCOPHAGE) 1000 MG tablet Take 1 tablet (1,000 mg total) by mouth 2 (two) times daily with a meal. 03/20/15   Shade Flood, MD  naproxen (NAPROSYN) 500 MG tablet Take 500 mg by mouth 2 (two) times daily. 05/01/20   [provider]  ondansetron (ZOFRAN) 4 MG tablet Take 4 mg by mouth 2 (two) times daily. 05/05/20   [provider]  predniSONE (DELTASONE) 50 MG tablet Take 1 pill daily for 5 days. 01/13/16   Charm Rings, MD  triamcinolone cream (KENALOG) 0.1 % Apply 1 application topically 2 (two) times daily. 03/20/15  Shade Flood, MD    Allergies    Patient has no known allergies.  Review of Systems   Review of Systems  All other systems reviewed and are negative.   Physical Exam Updated Vital Signs BP 131/87 (BP Location: Right Arm)   Pulse 67   Temp 97.8 F (36.6 C) (Oral)   Resp 16   Ht 1.702 m (5\' 7" )   Wt 83.6 kg   SpO2 95%   BMI 28.87 kg/m   Physical Exam Vitals and nursing note reviewed.  Constitutional:      General: He is not in acute distress.    Appearance: He is well-developed.  HENT:     Head: Normocephalic and atraumatic.     Right Ear: External ear normal.     Left Ear: External ear normal.  Eyes:     General: No scleral icterus.       Right eye: No discharge.        Left  eye: No discharge.     Conjunctiva/sclera: Conjunctivae normal.  Neck:     Trachea: No tracheal deviation.  Cardiovascular:     Rate and Rhythm: Normal rate and regular rhythm.  Pulmonary:     Effort: Pulmonary effort is normal. No respiratory distress.     Breath sounds: Normal breath sounds. No stridor. No wheezing or rales.  Abdominal:     General: Bowel sounds are normal. There is no distension.     Palpations: Abdomen is soft.     Tenderness: There is no abdominal tenderness. There is no guarding or rebound.  Musculoskeletal:        General: No tenderness.     Cervical back: Neck supple.  Skin:    General: Skin is warm and dry.     Findings: No rash.  Neurological:     Mental Status: He is alert and oriented to person, place, and time.     Cranial Nerves: No cranial nerve deficit (No facial droop, extraocular movements intact, tongue midline ).     Sensory: No sensory deficit.     Motor: No abnormal muscle tone or seizure activity.     Coordination: Coordination normal.     Comments: No pronator drift bilateral upper extrem, able to hold both legs off bed for 5 seconds, sensation intact in all extremities, no visual field cuts, no left or right sided neglect, normal finger-nose exam bilaterally, no nystagmus noted      ED Results / Procedures / Treatments   Labs (all labs ordered are listed, but only abnormal results are displayed) Labs Reviewed  CBC - Abnormal; Notable for the following components:      Result Value   WBC 11.4 (*)    All other components within normal limits  DIFFERENTIAL - Abnormal; Notable for the following components:   Neutro Abs 8.7 (*)    All other components within normal limits  COMPREHENSIVE METABOLIC PANEL - Abnormal; Notable for the following components:   Glucose, Bld 304 (*)    AST 12 (*)    Total Bilirubin 1.6 (*)    All other components within normal limits  URINALYSIS, ROUTINE W REFLEX MICROSCOPIC - Abnormal; Notable for the  following components:   Glucose, UA >=500 (*)    Ketones, ur 20 (*)    All other components within normal limits  RESP PANEL BY RT-PCR (FLU A&B, COVID) ARPGX2  ETHANOL  PROTIME-INR  APTT  RAPID URINE DRUG SCREEN, HOSP PERFORMED    EKG None  Radiology CT HEAD WO CONTRAST  Result Date: 05/07/2020 CLINICAL DATA:  Posterior headache, dizziness for 2 weeks. EXAM: CT HEAD WITHOUT CONTRAST TECHNIQUE: Contiguous axial images were obtained from the base of the skull through the vertex without intravenous contrast. COMPARISON:  None. FINDINGS: Brain: No evidence of acute infarction, hemorrhage, hydrocephalus, extra-axial collection or mass lesion/mass effect. Vascular: No hyperdense vessel or unexpected calcification. Skull: Normal. Negative for fracture or focal lesion. Sinuses/Orbits: No acute finding. Other: None. IMPRESSION: No acute intracranial process. Electronically Signed   By: Romona Curls M.D.   On: 05/07/2020 10:46    Procedures Procedures   Medications Ordered in ED Medications  prochlorperazine (COMPAZINE) injection 10 mg (10 mg Intravenous Given 05/07/20 1006)  diphenhydrAMINE (BENADRYL) injection 12.5 mg (12.5 mg Intravenous Given 05/07/20 1007)  ketorolac (TORADOL) 30 MG/ML injection 30 mg (30 mg Intravenous Given 05/07/20 1133)  morphine 4 MG/ML injection 4 mg (4 mg Intravenous Given 05/07/20 1226)    ED Course  I have reviewed the triage vital signs and the nursing notes.  Pertinent labs & imaging results that were available during my care of the patient were reviewed by me and considered in my medical decision making (see chart for details).  Clinical Course as of 05/07/20 1448  Sun May 07, 2020  1128 CT without acute findings. [JK]  1128 Laboratory tests notable for mild elevation white blood cell count [JK]  1448 MR brain negative [JK]    Clinical Course User Index [JK] Linwood Dibbles, MD   MDM Rules/Calculators/A&P                          Patient's ED work-up is  reassuring.  Patient was mildly hypertensive but not to the degree that would be consistent  hypertensive emergency.  Patient's white blood cell count slightly elevated 11 but he is not having any meningismus.  I doubt meningitis.  Patient's metabolic panel shows hyperglycemia but no signs of DKA.  Covid influenza are negative.  Patient's not anemic or dehydrated.  Patient's head CT does not show any signs of surgical hemorrhage, stroke or tumor.  Patient also does not have any focal neurologic deficits on exam.  I doubt stroke.  Patient symptoms may be related to a migraine headache.  He was treated with medications for pain.  Will discharge home with medications for migraine headache.  Recommend outpatient follow-up with primary doctor.  Warning signs precautions discussed. Final Clinical Impression(s) / ED Diagnoses Final diagnoses:  Other migraine with status migrainosus, not intractable  Hypertension, unspecified type  Hyperglycemia    Rx / DC Orders ED Discharge Orders         Ordered    butalbital-acetaminophen-caffeine (FIORICET) 50-325-40 MG tablet  Every 6 hours PRN        05/07/20 1206    ondansetron (ZOFRAN ODT) 8 MG disintegrating tablet  Every 8 hours PRN        05/07/20 1206           Linwood Dibbles, MD 05/07/20 1315

## 2020-05-07 NOTE — ED Notes (Signed)
Patient transported to CT via stretcher in stable condition 

## 2020-05-07 NOTE — ED Notes (Signed)
Patient transported to MRI via stretcher in stable condition 

## 2020-05-07 NOTE — ED Triage Notes (Signed)
Pt c/o headache, dizziness and fatigue X2 weeks. States BP has been high. Also states not eating.

## 2020-05-07 NOTE — ED Notes (Signed)
Pt a,bulated independently. C/O being dizzy. Able to ambulate.

## 2020-05-07 NOTE — ED Notes (Signed)
Pt returned from MRI °

## 2021-03-07 ENCOUNTER — Ambulatory Visit: Payer: Self-pay | Admitting: *Deleted

## 2021-03-07 NOTE — Telephone Encounter (Signed)
Interpreter: [id number] Chief Complaint: rectal hemorrhoids  Symptoms: uncomfortable, sometimes has bleeding Frequency: 3 years Pertinent Negatives: Patient denies pain Disposition: [] ED /[x] Urgent Care (no appt availability in office) / [] Appointment(In office/virtual)/ []  [facility]/ [] Home Care/ [] Refused Recommended Disposition /[] [facility]/ []  Follow-up with PCP Additional Notes: Patient has NP appointment at [facility]- advised patient UC for evaluation

## 2021-03-07 NOTE — Telephone Encounter (Signed)
Summary: hemorrhoids   The patient stated that he has hemorrhoids and that he has had them for a long time; he is not in pain, but he is very uncomfortable, and he is seeking clinical advice on what to do or where to go to have them removed. Pt stated the most discomfort he feels is when he is taking a shower.   Pt mentioned he has never spoke to anyone about it because he is embarrassed.     Pt has new pt appt scheduled at [facility] on 06/08.   Please advise.      Reason for Disposition  [1] Home treatment > 3 days for rectal pain AND [2] not improved  Answer Assessment - Initial Assessment Questions 1. SYMPTOM:  "What's the main symptom you're concerned about?" (e.g., pain, itching, swelling, rash)     Hemorrhoid discomfort 2. ONSET: "When did the hemorrhoids  start?"     3 years 3. RECTAL PAIN: "Do you have any pain around your rectum?" "How bad is the pain?"  (Scale 0-10; or mild, moderate, severe)   - NONE (0): no pain   - MILD (1-3): doesn't interfere with normal activities    - MODERATE (4-7): interferes with normal activities or awakens from sleep, limping    - SEVERE (8-10): excruciating pain, unable to have a bowel movement      None 4. RECTAL ITCHING: "Do you have any itching in this area?" "How bad is the itching?"  (Scale 0-10; or mild, moderate, severe)   - NONE: no itching   - MILD: doesn't interfere with normal activities    - MODERATE-SEVERE: interferes with normal activities or awakens from sleep     no 5. CONSTIPATION: "Do you have constipation?" If Yes, ask: "How bad is it?"     yes 6. CAUSE: "What do you think is causing the anus symptoms?"     hemorrhoid 7. OTHER SYMPTOMS: "Do you have any other symptoms?"  (e.g., abdomen pain, fever, rectal bleeding, vomiting)     Rectal bleeding at times 8. PREGNANCY: "Is there any chance you are pregnant?" "When was your last menstrual period?"     *No Answer*  Protocols used: Rectal Symptoms-A-AH

## 2021-06-21 ENCOUNTER — Ambulatory Visit: Payer: No Typology Code available for payment source | Admitting: Family Medicine

## 2021-10-15 ENCOUNTER — Ambulatory Visit: Payer: Self-pay | Attending: Family Medicine | Admitting: Family Medicine

## 2021-10-15 ENCOUNTER — Encounter: Payer: Self-pay | Admitting: Family Medicine

## 2021-10-15 VITALS — BP 147/90 | HR 82 | Temp 98.0°F | Ht 67.0 in | Wt 165.0 lb

## 2021-10-15 DIAGNOSIS — Z1159 Encounter for screening for other viral diseases: Secondary | ICD-10-CM

## 2021-10-15 DIAGNOSIS — E1159 Type 2 diabetes mellitus with other circulatory complications: Secondary | ICD-10-CM

## 2021-10-15 DIAGNOSIS — K648 Other hemorrhoids: Secondary | ICD-10-CM

## 2021-10-15 DIAGNOSIS — I152 Hypertension secondary to endocrine disorders: Secondary | ICD-10-CM

## 2021-10-15 DIAGNOSIS — E1169 Type 2 diabetes mellitus with other specified complication: Secondary | ICD-10-CM

## 2021-10-15 DIAGNOSIS — K5909 Other constipation: Secondary | ICD-10-CM

## 2021-10-15 LAB — POCT GLYCOSYLATED HEMOGLOBIN (HGB A1C): HbA1c, POC (controlled diabetic range): 9.6 % — AB (ref 0.0–7.0)

## 2021-10-15 LAB — GLUCOSE, POCT (MANUAL RESULT ENTRY): POC Glucose: 238 mg/dl — AB (ref 70–99)

## 2021-10-15 MED ORDER — AMLODIPINE BESYLATE 5 MG PO TABS: 5.0000 mg | ORAL_TABLET | Freq: Every day | ORAL | 6 refills | Status: DC

## 2021-10-15 MED ORDER — ATORVASTATIN CALCIUM 20 MG PO TABS: 20.0000 mg | ORAL_TABLET | Freq: Every day | ORAL | 6 refills | Status: DC

## 2021-10-15 MED ORDER — GLIPIZIDE 10 MG PO TABS: 10.0000 mg | ORAL_TABLET | Freq: Two times a day (BID) | ORAL | 6 refills | Status: DC

## 2021-10-15 MED ORDER — METFORMIN HCL 1000 MG PO TABS: 1000.0000 mg | ORAL_TABLET | Freq: Two times a day (BID) | ORAL | 6 refills | Status: DC

## 2021-10-15 MED ORDER — HYDROCORTISONE ACE-PRAMOXINE 1-1 % EX CREA: 1.0000 | TOPICAL_CREAM | Freq: Two times a day (BID) | CUTANEOUS | 2 refills | Status: AC

## 2021-10-15 MED ORDER — POLYETHYLENE GLYCOL 3350 17 GM/SCOOP PO POWD: 17.0000 g | Freq: Every day | ORAL | 1 refills | Status: DC

## 2021-10-15 NOTE — Progress Notes (Signed)
[id number] A1C-9.6

## 2021-10-15 NOTE — Progress Notes (Signed)
Subjective:  Patient ID: Clayton Farrell, male    DOB: 1965-05-30  Age: 56 y.o. MRN: [id number]  CC: Diabetes   HPI Clayton Farrell is a 56 y.o. year old male with a history of type 2 diabetes mellitus (A1c 9.6), hypertension here to establish care. He is accompanied by an in person interpreter.  Interval History:  He states he had been adherent with his medications and has been under the care of a Physician, last visit was 2 mos ago. Currently on glipizide and metformin and he has no hypoglycemia, numbness in extremities or visual concerns. He is currently not on a statin.  Chart reveals he should be on Lisinopril but he has been without it x2 months.   He complains of hemorrhoids which are sometimes painful but do not bleed.  Also endorses presence of constipation and has used an OTC laxative with no relief.  Past Medical History:  Diagnosis Date   Diabetes mellitus without complication (HCC)     No past surgical history on file.  Family History  Problem Relation Age of Onset   Diabetes Mother    Diabetes Sister    Diabetes Brother     Social History   Socioeconomic History   Marital status: Single    Spouse name: Not on file   Number of children: Not on file   Years of education: Not on file   Highest education level: Not on file  Occupational History   Not on file  Tobacco Use   Smoking status: Former    Types: Cigarettes    Quit date: 01/31/2008    Years since quitting: 13.7   Smokeless tobacco: Never  Substance and Sexual Activity   Alcohol use: No   Drug use: No   Sexual activity: Not on file  Other Topics Concern   Not on file  Social History Narrative   Not on file   Social Determinants of Health   Financial Resource Strain: Not on file  Food Insecurity: Not on file  Transportation Needs: Not on file  Physical Activity: Not on file  Stress: Not on file  Social Connections: Not on file    No Known Allergies  Outpatient Medications Prior  to Visit  Medication Sig Dispense Refill   lisinopril (ZESTRIL) 20 MG tablet Take 20 mg by mouth daily.     naproxen (NAPROSYN) 500 MG tablet Take 500 mg by mouth 2 (two) times daily.     glipiZIDE (GLUCOTROL) 5 MG tablet Take 1 tablet (5 mg total) by mouth 2 (two) times daily. Needs office visit 60 tablet 0   metFORMIN (GLUCOPHAGE) 1000 MG tablet Take 1 tablet (1,000 mg total) by mouth 2 (two) times daily with a meal. 180 tablet 0   glipiZIDE (GLUCOTROL XL) 5 MG 24 hr tablet Take 5 mg by mouth daily.     meloxicam (MOBIC) 15 MG tablet Take 1 tablet (15 mg total) by mouth daily. (Patient not taking: Reported on 05/07/2020) 30 tablet 0   ondansetron (ZOFRAN ODT) 8 MG disintegrating tablet Take 1 tablet (8 mg total) by mouth every 8 (eight) hours as needed for nausea or vomiting. (Patient not taking: Reported on 10/15/2021) 20 tablet 0   ondansetron (ZOFRAN) 4 MG tablet Take 4 mg by mouth 2 (two) times daily. (Patient not taking: Reported on 10/15/2021)     triamcinolone cream (KENALOG) 0.1 % Apply 1 application topically 2 (two) times daily. (Patient not taking: Reported on 05/07/2020) 30 g 0  No facility-administered medications prior to visit.     ROS Review of Systems  Constitutional:  Negative for activity change and appetite change.  HENT:  Negative for sinus pressure and sore throat.   Respiratory:  Negative for chest tightness, shortness of breath and wheezing.   Cardiovascular:  Negative for chest pain and palpitations.  Gastrointestinal:  Positive for constipation. Negative for abdominal distention and abdominal pain.  Genitourinary: Negative.   Musculoskeletal: Negative.   Psychiatric/Behavioral:  Negative for behavioral problems and dysphoric mood.     Objective:  BP (!) 147/90   Pulse 82   Temp 98 F (36.7 C) (Oral)   Ht $R'5\' 7"'iA$  (1.702 m)   Wt 165 lb (74.8 kg)   SpO2 96%   BMI 25.84 kg/m      10/15/2021    9:03 AM 05/07/2020    2:25 PM 05/07/2020    1:00 PM  BP/Weight   Systolic BP 216 244 695  Diastolic BP 90 90 87  Wt. (Lbs) 165    BMI 25.84 kg/m2        Physical Exam Constitutional:      Appearance: He is well-developed.  Cardiovascular:     Rate and Rhythm: Normal rate.     Heart sounds: Normal heart sounds. No murmur heard. Pulmonary:     Effort: Pulmonary effort is normal.     Breath sounds: Normal breath sounds. No wheezing or rales.  Chest:     Chest wall: No tenderness.  Abdominal:     General: Bowel sounds are normal. There is no distension.     Palpations: Abdomen is soft. There is no mass.     Tenderness: There is no abdominal tenderness.  Genitourinary:    Comments: Nonthrombosed hemorrhoid at 12 o'clock Musculoskeletal:        General: Normal range of motion.     Right lower leg: No edema.     Left lower leg: No edema.  Neurological:     Mental Status: He is alert and oriented to person, place, and time.  Psychiatric:        Mood and Affect: Mood normal.        Latest Ref Rng & Units 05/07/2020    9:39 AM 06/25/2013   10:24 AM 05/30/2013    3:28 PM  CMP  Glucose 70 - 99 mg/dL 304  113  217   BUN 6 - 20 mg/dL $Remove'11  12  10   'pYfHYby$ Creatinine 0.61 - 1.24 mg/dL 0.75  0.80  0.78   Sodium 135 - 145 mmol/L 136  139  138   Potassium 3.5 - 5.1 mmol/L 4.1  4.3  4.0   Chloride 98 - 111 mmol/L 101  107  104   CO2 22 - 32 mmol/L $RemoveB'23  23  22   'qnmTggrm$ Calcium 8.9 - 10.3 mg/dL 9.9  9.5  10.2   Total Protein 6.5 - 8.1 g/dL 7.5  7.2    Total Bilirubin 0.3 - 1.2 mg/dL 1.6  1.0    Alkaline Phos 38 - 126 U/L 77  55    AST 15 - 41 U/L 12  34    ALT 0 - 44 U/L 18  71      Lipid Panel     Component Value Date/Time   CHOL 118 06/25/2013 1024   TRIG 49 06/25/2013 1024   HDL 40 06/25/2013 1024   CHOLHDL 3.0 06/25/2013 1024   VLDL 10 06/25/2013 1024   LDLCALC 68 06/25/2013 1024  CBC    Component Value Date/Time   WBC 11.4 (H) 05/07/2020 0939   RBC 5.31 05/07/2020 0939   HGB 16.5 05/07/2020 0939   HCT 46.8 05/07/2020 0939   PLT 266  05/07/2020 0939   MCV 88.1 05/07/2020 0939   MCH 31.1 05/07/2020 0939   MCHC 35.3 05/07/2020 0939   RDW 11.5 05/07/2020 0939   LYMPHSABS 1.8 05/07/2020 0939   MONOABS 0.8 05/07/2020 0939   EOSABS 0.1 05/07/2020 0939   BASOSABS 0.1 05/07/2020 0939    Lab Results  Component Value Date   HGBA1C 9.6 (A) 10/15/2021    Assessment & Plan:  1. Type 2 diabetes mellitus with other specified complication, without long-term current use of insulin (HCC) Uncontrolled with A1c of 9.6; goal is less than 7.0 Increase dose of glipizide from 5 mg to 10 mg twice daily, continue metformin We will check A1c at next visit and adjust regimen accordingly Statin initiated Counseled on Diabetic diet, my plate method, 128 minutes of moderate intensity exercise/week Blood sugar logs with fasting goals of 80-120 mg/dl, random of less than 575 and in the event of sugars less than 60 mg/dl or greater than 627 mg/dl encouraged to notify the clinic. Advised on the need for annual eye exams, annual foot exams, Pneumonia vaccine. - POCT glucose (manual entry) - POCT glycosylated hemoglobin (Hb A1C) - metFORMIN (GLUCOPHAGE) 1000 MG tablet; Take 1 tablet (1,000 mg total) by mouth 2 (two) times daily with a meal.  Dispense: 120 tablet; Refill: 6 - glipiZIDE (GLUCOTROL) 10 MG tablet; Take 1 tablet (10 mg total) by mouth 2 (two) times daily. Needs office visit  Dispense: 60 tablet; Refill: 6 - atorvastatin (LIPITOR) 20 MG tablet; Take 1 tablet (20 mg total) by mouth daily.  Dispense: 30 tablet; Refill: 6 - Microalbumin / creatinine urine ratio - LP+Non-HDL Cholesterol - CMP14+EGFR  2. Hypertension associated with diabetes (HCC) Uncontrolled as he has not been on any medications We will substitute lisinopril for amlodipine Counseled on blood pressure goal of less than 130/80, low-sodium, DASH diet, medication compliance, 150 minutes of moderate intensity exercise per week. Discussed medication compliance, adverse  effects. -amLODipine (NORVASC) 5 MG tablet; Take 1 tablet (5 mg total) by mouth daily.  Dispense: 30 tablet; Refill: 6  3. Screening for viral disease  - HCV Ab w Reflex to Quant PCR - HIV Antibody (routine testing w rflx)  4. Other constipation Uncontrolled Placed on MiraLAX Counseled on increasing fiber intake, fruits and vegetable, limit intake of foods like cheese, white bread, white rice   5. Other hemorrhoids Secondary to constipation Counseled on measures to prevent constipation Placed on Analpram If at next visit if symptoms persist consider referral to general surgeon Advised to obtain paperwork for the [facility] [employer] discount at the front desk.     Meds ordered this encounter  Medications   amLODipine (NORVASC) 5 MG tablet    Sig: Take 1 tablet (5 mg total) by mouth daily.    Dispense:  30 tablet    Refill:  6   metFORMIN (GLUCOPHAGE) 1000 MG tablet    Sig: Take 1 tablet (1,000 mg total) by mouth 2 (two) times daily with a meal.    Dispense:  120 tablet    Refill:  6   glipiZIDE (GLUCOTROL) 10 MG tablet    Sig: Take 1 tablet (10 mg total) by mouth 2 (two) times daily. Needs office visit    Dispense:  60 tablet    Refill:  6   atorvastatin (LIPITOR) 20 MG tablet    Sig: Take 1 tablet (20 mg total) by mouth daily.    Dispense:  30 tablet    Refill:  6   polyethylene glycol powder (GLYCOLAX/MIRALAX) 17 GM/SCOOP powder    Sig: Take 17 g by mouth daily.    Dispense:  3350 g    Refill:  1   pramoxine-hydrocortisone (ANALPRAM-HC) 1-1 % rectal cream    Sig: Place 1 Application rectally 2 (two) times daily.    Dispense:  30 g    Refill:  2    Follow-up: Return in about 3 months (around 01/15/2022) for Chronic medical conditions.       Charlott Rakes, MD, FAAFP. [facility] [place], [place]   10/15/2021, 11:22 AM

## 2021-10-15 NOTE — Patient Instructions (Signed)
Diabetes mellitus y nutricin, en adultos Diabetes Mellitus and Nutrition, Adult Si sufre de diabetes, o diabetes mellitus, es muy importante tener hbitos alimenticios saludables debido a que sus niveles de azcar en la sangre (glucosa) se ven afectados en gran medida por lo que come y bebe. Comer alimentos saludables en las cantidades correctas, aproximadamente a la misma hora todos los das, lo ayudar a: Controlar su glucemia. Disminuir el riesgo de sufrir una enfermedad cardaca. Mejorar la presin arterial. Alcanzar o mantener un peso saludable. Qu puede afectar mi plan de alimentacin? Todas las personas que sufren de diabetes son diferentes y cada una tiene necesidades diferentes en cuanto a un plan de alimentacin. El mdico puede recomendarle que trabaje con un nutricionista para elaborar el mejor plan para usted. Su plan de alimentacin puede variar segn factores como: Las caloras que necesita. Los medicamentos que toma. Su peso. Sus niveles de glucemia, presin arterial y colesterol. Su nivel de actividad. Otras afecciones que tenga, como enfermedades cardacas o renales. Cmo me afectan los carbohidratos? Los carbohidratos, o hidratos de carbono, afectan su nivel de glucemia ms que cualquier otro tipo de alimento. La ingesta de carbohidratos aumenta la cantidad de glucosa en la sangre. Es importante conocer la cantidad de carbohidratos que se pueden ingerir en cada comida sin correr ningn riesgo. Esto es diferente en cada persona. Su nutricionista puede ayudarlo a calcular la cantidad de carbohidratos que debe ingerir en cada comida y en cada refrigerio. Cmo me afecta el alcohol? El alcohol puede provocar una disminucin de la glucemia (hipoglucemia), especialmente si usa insulina o toma determinados medicamentos por va oral para la diabetes. La hipoglucemia es una afeccin potencialmente mortal. Los sntomas de la hipoglucemia, como somnolencia, mareos y confusin, son  similares a los sntomas de haber consumido demasiado alcohol. No beba alcohol si: Su mdico le indica no hacerlo. Est embarazada, puede estar embarazada o est tratando de quedar embarazada. Si bebe alcohol: Limite la cantidad que bebe a lo siguiente: De 0 a 1 medida por da para las mujeres. De 0 a 2 medidas por da para los hombres. Sepa cunta cantidad de alcohol hay en las bebidas que toma. En los [place], una medida equivale a una botella de cerveza de 12 oz (355 ml), un vaso de vino de 5 oz (148 ml) o un vaso de una bebida alcohlica de alta graduacin de 1 oz (44 ml). Mantngase hidratado bebiendo agua, refrescos dietticos o t helado sin azcar. Tenga en cuenta que los refrescos comunes, los jugos y otras bebidas para mezclar pueden contener mucha azcar y se deben contar como carbohidratos. Consejos para seguir este plan  Leer las etiquetas de los alimentos Comience por leer el tamao de la porcin en la etiqueta de Informacin nutricional de los alimentos envasados y las bebidas. La cantidad de caloras, carbohidratos, grasas y otros nutrientes detallados en la etiqueta se basan en una porcin del alimento. Muchos alimentos contienen ms de una porcin por envase. Verifique la cantidad total de gramos (g) de carbohidratos totales en una porcin. Verifique la cantidad de gramos de grasas saturadas y grasas trans en una porcin. Escoja alimentos que no contengan estas grasas o que su contenido de estas sea bajo. Verifique la cantidad de miligramos (mg) de sal (sodio) en una porcin. La mayora de las personas deben limitar la ingesta de sodio total a menos de 2300 mg por da. Siempre consulte la informacin nutricional de los alimentos etiquetados como "con bajo contenido de grasa" o "sin grasa".   Estos alimentos pueden tener un mayor contenido de azcar agregada o carbohidratos refinados, y deben evitarse. Hable con su nutricionista para identificar sus objetivos diarios en  cuanto a los nutrientes mencionados en la etiqueta. Al ir de compras Evite comprar alimentos procesados, enlatados o precocidos. Estos alimentos tienden a tener una mayor cantidad de grasa, sodio y azcar agregada. Compre en la zona exterior de la tienda de comestibles. Esta es la zona donde se encuentran con mayor frecuencia las frutas y las verduras frescas, los cereales a granel, las carnes frescas y los productos lcteos frescos. Al cocinar Use mtodos de coccin a baja temperatura, como hornear, en lugar de mtodos de coccin a alta temperatura, como frer en abundante aceite. Cocine con aceites saludables, como el aceite de oliva, canola o girasol. Evite cocinar con manteca, crema o carnes con alto contenido de grasa. Planificacin de las comidas Coma las comidas y los refrigerios regularmente, preferentemente a la misma hora todos los das. Evite pasar largos perodos de tiempo sin comer. Consuma alimentos ricos en fibra, como frutas frescas, verduras, frijoles y cereales integrales. Consuma entre 4 y 6 onzas (entre 112 y 168 g) de protenas magras por da, como carnes magras, pollo, pescado, huevos o tofu. Una onza (oz) (28 g) de protena magra equivale a: 1 onza (28 g) de carne, pollo o pescado. 1 huevo.  taza (62 g) de tofu. Coma algunos alimentos por da que contengan grasas saludables, como aguacates, frutos secos, semillas y pescado. Qu alimentos debo comer? Frutas Bayas. Manzanas. Naranjas. Duraznos. Damascos. Ciruelas. Uvas. Mangos. Papayas. Granadas. Kiwi. Cerezas. Verduras Verduras de hoja verde, que incluyen lechuga, espinaca, col rizada, acelga, hojas de berza, hojas de mostaza y repollo. Remolachas. Coliflor. Brcoli. Zanahorias. Judas verdes. Tomates. Pimientos. Cebollas. Pepinos. Coles de Bruselas. Granos Granos integrales, como panes, galletas, tortillas, cereales y pastas de salvado o integrales. Avena sin azcar. Quinua. Arroz integral o salvaje. Carnes y otras  protenas Frutos de mar. Carne de ave sin piel. Cortes magros de ave y carne de res. Tofu. Frutos secos. Semillas. Lcteos Productos lcteos sin grasa o con bajo contenido de grasa, como leche, yogur y queso. Es posible que los productos detallados arriba no constituyan una lista completa de los alimentos y las bebidas que puede tomar. Consulte a un nutricionista para obtener ms informacin. Qu alimentos debo evitar? Frutas Frutas enlatadas al almbar. Verduras Verduras enlatadas. Verduras congeladas con mantequilla o salsa de crema. Granos Productos elaborados con harina y harina blanca refinada, como panes, pastas, bocadillos y cereales. Evite todos los alimentos procesados. Carnes y otras protenas Cortes de carne con alto contenido de grasa. Carne de ave con piel. Carnes empanizadas o fritas. Carne procesada. Evite las grasas saturadas. Lcteos Yogur, queso o leche enteros. Bebidas Bebidas azucaradas, como gaseosas o t helado. Es posible que los productos que se enumeran ms arriba no constituyan una lista completa de los alimentos y las bebidas que debe evitar. Consulte a un nutricionista para obtener ms informacin. Preguntas para hacerle al mdico Debo consultar con un especialista certificado en atencin y educacin sobre la diabetes? Es necesario que me rena con un nutricionista? A qu nmero puedo llamar si tengo preguntas? Cules son los mejores momentos para controlar la glucemia? Dnde encontrar ms informacin: American Diabetes Association (Asociacin Estadounidense de la Diabetes): diabetes.org Academy of Nutrition and Dietetics (Academia de Nutricin y Diettica): eatright.org National Institute of Diabetes and Digestive and Kidney Diseases (Instituto Nacional de la Diabetes y las Enfermedades Digestivas y Renales): niddk.nih.gov Association of Diabetes   Care & Education Specialists (Asociacin de Especialistas en Atencin y Educacin sobre la Diabetes):  diabeteseducator.org Resumen Es importante tener hbitos alimenticios saludables debido a que sus niveles de azcar en la sangre (glucosa) se ven afectados en gran medida por lo que come y bebe. Es importante consumir alcohol con prudencia. Un plan de comidas saludable lo ayudar a controlar la glucosa en sangre y a reducir el riesgo de enfermedades cardacas. El mdico puede recomendarle que trabaje con un nutricionista para elaborar el mejor plan para usted. Esta informacin no tiene como fin reemplazar el consejo del mdico. Asegrese de hacerle al mdico cualquier pregunta que tenga. Document Revised: 09/08/2019 Document Reviewed: 09/08/2019 Elsevier Patient Education  2023 Elsevier Inc.  

## 2021-10-18 LAB — LP+NON-HDL CHOLESTEROL
Cholesterol, Total: 181 mg/dL (ref 100–199)
HDL: 41 mg/dL (ref >39)
LDL Chol Calc (NIH): 120 mg/dL — ABNORMAL HIGH (ref 0–99)
Total Non-HDL-Chol (LDL+VLDL): 140 mg/dL — ABNORMAL HIGH (ref 0–129)
Triglycerides: 107 mg/dL (ref 0–149)
VLDL Cholesterol Cal: 20 mg/dL (ref 5–40)

## 2021-10-18 LAB — CMP14+EGFR
ALT: 23 IU/L (ref 0–44)
AST: 17 IU/L (ref 0–40)
Albumin/Globulin Ratio: 1.7 (ref 1.2–2.2)
Albumin: 5 g/dL — ABNORMAL HIGH (ref 3.8–4.9)
Alkaline Phosphatase: 98 IU/L (ref 44–121)
BUN/Creatinine Ratio: 23 — ABNORMAL HIGH (ref 9–20)
BUN: 22 mg/dL (ref 6–24)
Bilirubin Total: 0.7 mg/dL (ref 0.0–1.2)
CO2: 20 mmol/L (ref 20–29)
Calcium: 9.9 mg/dL (ref 8.7–10.2)
Chloride: 108 mmol/L — ABNORMAL HIGH (ref 96–106)
Creatinine, Ser: 0.94 mg/dL (ref 0.76–1.27)
Globulin, Total: 2.9 g/dL (ref 1.5–4.5)
Glucose: 230 mg/dL — ABNORMAL HIGH (ref 70–99)
Potassium: 4.6 mmol/L (ref 3.5–5.2)
Sodium: 144 mmol/L (ref 134–144)
Total Protein: 7.9 g/dL (ref 6.0–8.5)
eGFR: 95 mL/min/{1.73_m2} (ref >59)

## 2021-10-18 LAB — MICROALBUMIN / CREATININE URINE RATIO
Creatinine, Urine: 29.3 mg/dL
Microalb/Creat Ratio: 85 mg/g creat — ABNORMAL HIGH (ref 0–29)
Microalbumin, Urine: 24.9 ug/mL

## 2021-10-18 LAB — HIV ANTIBODY (ROUTINE TESTING W REFLEX): HIV Screen 4th Generation wRfx: NONREACTIVE

## 2021-10-18 LAB — HCV AB W REFLEX TO QUANT PCR: HCV Ab: NONREACTIVE

## 2021-10-18 LAB — HCV INTERPRETATION

## 2021-12-22 ENCOUNTER — Other Ambulatory Visit: Payer: Self-pay | Admitting: Family Medicine

## 2022-01-21 ENCOUNTER — Ambulatory Visit: Payer: Self-pay | Attending: Family Medicine | Admitting: Family Medicine

## 2022-04-01 ENCOUNTER — Ambulatory Visit: Payer: Self-pay | Attending: Family Medicine | Admitting: Family Medicine

## 2022-04-01 ENCOUNTER — Encounter: Payer: Self-pay | Admitting: Family Medicine

## 2022-04-01 VITALS — BP 99/65 | HR 78 | Ht 67.0 in | Wt 164.2 lb

## 2022-04-01 DIAGNOSIS — I152 Hypertension secondary to endocrine disorders: Secondary | ICD-10-CM

## 2022-04-01 DIAGNOSIS — E1159 Type 2 diabetes mellitus with other circulatory complications: Secondary | ICD-10-CM

## 2022-04-01 DIAGNOSIS — E1169 Type 2 diabetes mellitus with other specified complication: Secondary | ICD-10-CM

## 2022-04-01 LAB — POCT GLYCOSYLATED HEMOGLOBIN (HGB A1C): HbA1c, POC (controlled diabetic range): 11.4 % — AB (ref 0.0–7.0)

## 2022-04-01 LAB — GLUCOSE, POCT (MANUAL RESULT ENTRY): POC Glucose: 204 mg/dl — AB (ref 70–99)

## 2022-04-01 MED ORDER — GLIPIZIDE 10 MG PO TABS: 10.0000 mg | ORAL_TABLET | Freq: Two times a day (BID) | ORAL | 1 refills | Status: DC

## 2022-04-01 MED ORDER — ATORVASTATIN CALCIUM 20 MG PO TABS: 20.0000 mg | ORAL_TABLET | Freq: Every day | ORAL | 1 refills | Status: DC

## 2022-04-01 MED ORDER — LISINOPRIL 20 MG PO TABS: 20.0000 mg | ORAL_TABLET | Freq: Every day | ORAL | 1 refills | Status: DC

## 2022-04-01 MED ORDER — AMLODIPINE BESYLATE 2.5 MG PO TABS: 2.5000 mg | ORAL_TABLET | Freq: Every day | ORAL | 1 refills | Status: DC

## 2022-04-01 MED ORDER — METFORMIN HCL 1000 MG PO TABS: 1000.0000 mg | ORAL_TABLET | Freq: Two times a day (BID) | ORAL | 1 refills | Status: DC

## 2022-04-01 NOTE — Progress Notes (Signed)
Subjective:  Patient ID: Clayton Farrell, male    DOB: 1965-05-04  Age: 57 y.o. MRN: [id number]  CC: Diabetes   HPI Clayton Farrell is a 57 y.o. year old male with a history of type 2 diabetes mellitus (A1c 11.4), hypertension.  Interval History:  A1c is 11.4 up from 9.6 previously. Glipizide  dose had been increased from 5mg  bid at his last visit. He declines insulin and then goes on to inform me he has not been totally adherent with his Diabetic medications. He is not adherent with a diabetic diet and does not exercise.  Past Medical History:  Diagnosis Date   Diabetes mellitus without complication ([place])     No past surgical history on file.  Family History  Problem Relation Age of Onset   Diabetes Mother    Diabetes Sister    Diabetes Brother     Social History   Socioeconomic History   Marital status: Single    Spouse name: Not on file   Number of children: Not on file   Years of education: Not on file   Highest education level: Not on file  Occupational History   Not on file  Tobacco Use   Smoking status: Former    Types: Cigarettes    Quit date: 01/31/2008    Years since quitting: 14.1   Smokeless tobacco: Never  Substance and Sexual Activity   Alcohol use: No   Drug use: No   Sexual activity: Not on file  Other Topics Concern   Not on file  Social History Narrative   Not on file   Social Determinants of Health   Financial Resource Strain: Not on file  Food Insecurity: Not on file  Transportation Needs: Not on file  Physical Activity: Not on file  Stress: Not on file  Social Connections: Not on file    No Known Allergies  Outpatient Medications Prior to Visit  Medication Sig Dispense Refill   naproxen (NAPROSYN) 500 MG tablet Take 500 mg by mouth 2 (two) times daily.     polyethylene glycol powder (GAVILAX) 17 GM/SCOOP powder TAKE ONE CAPFUL (17 GRAMS) BY MOUTH DAILY. 510 g 0   pramoxine-hydrocortisone (ANALPRAM-HC) 1-1 % rectal cream Place 1  Application rectally 2 (two) times daily. 30 g 2   amLODipine (NORVASC) 5 MG tablet Take 1 tablet (5 mg total) by mouth daily. 30 tablet 6   atorvastatin (LIPITOR) 20 MG tablet Take 1 tablet (20 mg total) by mouth daily. 30 tablet 6   glipiZIDE (GLUCOTROL) 10 MG tablet Take 1 tablet (10 mg total) by mouth 2 (two) times daily. Needs office visit 60 tablet 6   lisinopril (ZESTRIL) 20 MG tablet Take 20 mg by mouth daily.     metFORMIN (GLUCOPHAGE) 1000 MG tablet Take 1 tablet (1,000 mg total) by mouth 2 (two) times daily with a meal. 120 tablet 6   No facility-administered medications prior to visit.     ROS Review of Systems  Constitutional:  Negative for activity change and appetite change.  HENT:  Negative for sinus pressure and sore throat.   Respiratory:  Negative for chest tightness, shortness of breath and wheezing.   Cardiovascular:  Negative for chest pain and palpitations.  Gastrointestinal:  Negative for abdominal distention, abdominal pain and constipation.  Genitourinary: Negative.   Musculoskeletal: Negative.   Psychiatric/Behavioral:  Negative for behavioral problems and dysphoric mood.     Objective:  BP 99/65   Pulse 78   Ht  5\' 7"  (1.702 m)   Wt 164 lb 3.2 oz (74.5 kg)   SpO2 99%   BMI 25.72 kg/m      04/01/2022    2:08 PM 10/15/2021    9:03 AM 05/07/2020    2:25 PM  BP/Weight  Systolic BP 99 [id number]  Diastolic BP 65 90 90  Wt. (Lbs) 164.2 165   BMI 25.72 kg/m2 25.84 kg/m2       Physical Exam Constitutional:      Appearance: He is well-developed.  Cardiovascular:     Rate and Rhythm: Normal rate.     Heart sounds: Normal heart sounds. No murmur heard. Pulmonary:     Effort: Pulmonary effort is normal.     Breath sounds: Normal breath sounds. No wheezing or rales.  Chest:     Chest wall: No tenderness.  Abdominal:     General: Bowel sounds are normal. There is no distension.     Palpations: Abdomen is soft. There is no mass.     Tenderness: There  is no abdominal tenderness.  Musculoskeletal:        General: Normal range of motion.     Right lower leg: No edema.     Left lower leg: No edema.  Neurological:     Mental Status: He is alert and oriented to person, place, and time.  Psychiatric:        Mood and Affect: Mood normal.        Latest Ref Rng & Units 10/15/2021   10:15 AM 05/07/2020    9:39 AM 06/25/2013   10:24 AM  CMP  Glucose 70 - 99 mg/dL 230  304  113   BUN 6 - 24 mg/dL 22  11  12    Creatinine 0.76 - 1.27 mg/dL 0.94  0.75  0.80   Sodium 134 - 144 mmol/L 144  136  139   Potassium 3.5 - 5.2 mmol/L 4.6  4.1  4.3   Chloride 96 - 106 mmol/L 108  101  107   CO2 20 - 29 mmol/L 20  23  23    Calcium 8.7 - 10.2 mg/dL 9.9  9.9  9.5   Total Protein 6.0 - 8.5 g/dL 7.9  7.5  7.2   Total Bilirubin 0.0 - 1.2 mg/dL 0.7  1.6  1.0   Alkaline Phos 44 - 121 IU/L 98  77  55   AST 0 - 40 IU/L 17  12  34   ALT 0 - 44 IU/L 23  18  71     Lipid Panel     Component Value Date/Time   CHOL 181 10/15/2021 1015   TRIG 107 10/15/2021 1015   HDL 41 10/15/2021 1015   CHOLHDL 3.0 06/25/2013 1024   VLDL 10 06/25/2013 1024   LDLCALC 120 (H) 10/15/2021 1015    CBC    Component Value Date/Time   WBC 11.4 (H) 05/07/2020 0939   RBC 5.31 05/07/2020 0939   HGB 16.5 05/07/2020 0939   HCT 46.8 05/07/2020 0939   PLT 266 05/07/2020 0939   MCV 88.1 05/07/2020 0939   MCH 31.1 05/07/2020 0939   MCHC 35.3 05/07/2020 0939   RDW 11.5 05/07/2020 0939   LYMPHSABS 1.8 05/07/2020 0939   MONOABS 0.8 05/07/2020 0939   EOSABS 0.1 05/07/2020 0939   BASOSABS 0.1 05/07/2020 0939    Lab Results  Component Value Date   HGBA1C 11.4 (A) 04/01/2022    Assessment & Plan:  1. Type 2 diabetes  mellitus with other specified complication, without long-term current use of insulin (HCC) Uncontrolled with A1c of 11.4, goal is less than 7.0 Discussed initiating an injectable but he declines Advised that unfortunately he is at high high risk of complications  but he is insistent on not initiating an injectable Counseled on Diabetic diet, my plate method, [id number] minutes of moderate intensity exercise/week Blood sugar logs with fasting goals of 80-120 mg/dl, random of less than 180 and in the event of sugars less than 60 mg/dl or greater than 400 mg/dl encouraged to notify the clinic. Advised on the need for annual eye exams, annual foot exams, Pneumonia vaccine. - POCT glucose (manual entry) - POCT glycosylated hemoglobin (Hb [id number]) - Basic Metabolic Panel - atorvastatin (LIPITOR) 20 MG tablet; Take 1 tablet (20 mg total) by mouth daily.  Dispense: 90 tablet; Refill: 1 - glipiZIDE (GLUCOTROL) 10 MG tablet; Take 1 tablet (10 mg total) by mouth 2 (two) times daily.  Dispense: 180 tablet; Refill: 1 - metFORMIN (GLUCOPHAGE) 1000 MG tablet; Take 1 tablet (1,000 mg total) by mouth 2 (two) times daily with a meal.  Dispense: 360 tablet; Refill: 1  2. Hypertension associated with diabetes ([place]) Controlled Counseled on blood pressure goal of less than 130/80, low-sodium, DASH diet, medication compliance, 150 minutes of moderate intensity exercise per week. Discussed medication compliance, adverse effects. - amLODipine (NORVASC) 2.5 MG tablet; Take 1 tablet (2.5 mg total) by mouth daily.  Dispense: 90 tablet; Refill: 1 - lisinopril (ZESTRIL) 20 MG tablet; Take 1 tablet (20 mg total) by mouth daily.  Dispense: 90 tablet; Refill: 1    Meds ordered this encounter  Medications   amLODipine (NORVASC) 2.5 MG tablet    Sig: Take 1 tablet (2.5 mg total) by mouth daily.    Dispense:  90 tablet    Refill:  1   atorvastatin (LIPITOR) 20 MG tablet    Sig: Take 1 tablet (20 mg total) by mouth daily.    Dispense:  90 tablet    Refill:  1   glipiZIDE (GLUCOTROL) 10 MG tablet    Sig: Take 1 tablet (10 mg total) by mouth 2 (two) times daily.    Dispense:  180 tablet    Refill:  1   lisinopril (ZESTRIL) 20 MG tablet    Sig: Take 1 tablet (20 mg total) by mouth daily.     Dispense:  90 tablet    Refill:  1   metFORMIN (GLUCOPHAGE) 1000 MG tablet    Sig: Take 1 tablet (1,000 mg total) by mouth 2 (two) times daily with a meal.    Dispense:  360 tablet    Refill:  1    Follow-up: Return in about 3 months (around 07/02/2022) for Chronic medical conditions.       Charlott Rakes, MD, FAAFP. [facility] [place], [place]   04/01/2022, 6:22 PM

## 2022-04-01 NOTE — Patient Instructions (Signed)
Prevencin de las complicaciones de la diabetes mellitus Preventing Diabetes Mellitus Complications Usted puede ayudar a prevenir o [employer] problemas causados por la diabetes (diabetes mellitus). Si sigue su plan para la diabetes y se cuida, puede reducir las probabilidades de [employer] graves. Qu puedo hacer para prevenir los problemas que causa la diabetes? Cmo controlar la diabetes  Siga las indicaciones del mdico acerca de cmo controlar la diabetes. Posiblemente tenga que trabajar junto con un equipo de mdicos. Ellos pueden ensearle cmo cuidarse y responder cualquier pregunta que tenga. Infrmese sobre su enfermedad. Esto puede ayudarlo a tomar decisiones saludables en lo que respecta a comer y Cytogeneticist. Sepa cul es el rango objetivo de azcar en la sangre (glucosa) para usted. Controle su nivel de glucemia. El mdico lo ayudar a decidir con qu frecuencia deber [employer]. La frecuencia de control puede depender de sus objetivos de tratamiento y de cmo los est cumpliendo. Pregntele al mdico si debe tomar una dosis baja de aspirina por da. Pregunte qu dosis es la mejor para usted. Tomar una dosis baja de aspirina puede ayudar a prevenir enfermedades cardacas. Controlar la presin arterial y el colesterol Su objetivo de presin arterial se basa en: Su edad. Los medicamentos que toma. El tiempo transcurrido desde que tiene diabetes. Otras enfermedades que tenga. Controlar su colesterol, puede: Ayudarlo a prevenir enfermedades cardacas y un accidente cerebrovascular. Mejorar el flujo de [place]. Para controlar la presin arterial y el colesterol: Siga las indicaciones del mdico sobre la planificacin de las comidas, el ejercicio y los medicamentos. Asegrese de que el mdico le mida la presin arterial en cada visita. Contrlese la presin arterial en su casa segn las indicaciones del mdico. Controle su nivel de colesterol por lo  menos una vez al ao. Un medicamento llamado estatina puede ayudar a Designer, fashion/clothing. Pregntele a su mdico si est tomando una estatina o si debera tomarla.  Citas mdicas y vacunas Hgase exmenes fsicos y [employer]. Los mdicos le dirn la frecuencia con la que debe ir a su consultorio. Puede depender de su plan para la diabetes. Cada visita a un mdico deber incluir la medicin de: Su peso. La presin arterial. Su glucemia. Su nivel de A1c debe medirse: Al menos 2 veces al ao, si cumple los objetivos del tratamiento. Cuatro veces al ao, si no cumple los objetivos del tratamiento o si sus objetivos [place]. Los lpidos de la sangre (perfil lipdico) deben controlarse una vez al ao. Tambin se debe controlar una vez al ao la presencia de protenas en la orina (microalbuminuria). Si tiene diabetes tipo 1, hgase un examen ocular dentro de los 5 aos posteriores al diagnstico. Hgase un examen una vez al ao despus de ese primer examen. Si tiene diabetes tipo 2, hgase un examen ocular tan pronto reciba el diagnstico. Hgase un examen una vez al ao despus de Curator. [place]. Debe recibir: La vacuna contra la gripe todos los aos. La vacuna contra la neumona y la vacuna contra la hepatitis B. Si tiene 65 aos o ms, puede recibir la vacuna antineumoccica como una serie de dos inyecciones. Pregntele al mdico qu otras vacunas puede necesitar. Concurra a [place]. Esto puede ayudarlo a tener la capacidad de evitar problemas o, si ya los hay, a detectarlos y tratarlos precozmente. Estilo de vida No consuma ningn producto que contenga nicotina o tabaco. Estos productos incluyen cigarrillos, tabaco para  mascar y aparatos de vapeo, como los Psychologist, sport and exercise. Si necesita ayuda para dejar de consumir estos productos, consulte al [employer]. Si deja de fumar, puede: Reducir el riesgo de infarto de  miocardio, accidente cerebrovascular, enfermedades del sistema nervioso y enfermedad renal. Ayudar a que la sangre circule mejor por el cuerpo. Ayudar a sus niveles de presin arterial y colesterol. No beba alcohol si: Su mdico le indica no hacerlo. Est embarazada, puede estar embarazada o est tratando de [place]. Si bebe alcohol: Limite la cantidad que bebe a lo siguiente: De 0 a 1 medida por da para las mujeres. De 0 a 2 medidas por da para los hombres. Sepa cunta cantidad de alcohol hay en las bebidas que toma. En los [place], una medida equivale a una botella de cerveza de 12 oz (355 ml), un vaso de vino de 5 oz (148 ml) o un vaso de una bebida alcohlica de alta graduacin de 1 oz (44 ml). Cuidado de los pies La diabetes puede hacer que el flujo sanguneo a las piernas y los pies sea deficiente. Tambin puede causarle lo siguiente: Que la piel de los pies se vuelva ms fina, se rompa ms fcilmente y cicatrice ms lentamente. Dao nervioso en las piernas y los pies. Esto puede provocar una disminucin de la sensibilidad. Es posible que no note lesiones pequeas. Esto podra dar lugar a Agricultural engineer. Para evitar problemas en los pies: Examine a diario su piel y pies en busca de cortes, moretones, enrojecimiento, ampollas o llagas. Haga una cita para que el mdico le controle los pies una vez al ao. Durante el examen, el mdico har lo siguiente: Observar la estructura y la piel de sus pies. Controlar sus pulsos y el nivel de sensibilidad de los pies. Asegrese de que su mdico le haga un examen visual de los pies en cada visita.  Cuidado de los [employer] personas con una diabetes que no est bien controlada tienen ms probabilidades de Insurance risk surveyor enfermedad de las encas (enfermedad periodontal). La diabetes puede hacer que la enfermedad periodontal sea ms difcil de controlar. Si no se la trata, puede provocar la prdida de dientes. Para  prevenirlas: Cepllese los [employer]. Use hilo dental al menos una vez al da. Visite al [employer] por ao. Control del estrs Vivir con diabetes puede ser estresante. Cuando est estresado, puede: Tener una glucemia ms alta debido a las hormonas del estrs. Estar distrado y dejar de cuidarse como debera. Est al tanto del nivel de estrs y haga los cambios que sean necesarios para ayudar a Air cabin crew los momentos difciles. Para disminuir sus niveles de estrs: Scientist, water quality en un grupo de apoyo. Realice relajacin o meditacin planificada. Adopte un pasatiempo que disfrute. Mantenga relaciones saludables. Trate de Engineer, site [employer]. Trabaje con su mdico o un profesional de la salud mental. Dnde obtener ms informacin American Diabetes Association (Asociacin Estadounidense de la Diabetes): diabetes.org Association of Diabetes Care & Education Specialists (Asociacin de Especialistas en Atencin y Educacin sobre la Diabetes): diabeteseducator.org Esta informacin no tiene Marine scientist el consejo del mdico. Asegrese de hacerle al mdico cualquier pregunta que tenga. Document Revised: 07/24/2021 Document Reviewed: 07/24/2021 Elsevier Patient Education  [place].

## 2022-04-02 ENCOUNTER — Other Ambulatory Visit: Payer: Self-pay | Admitting: Family Medicine

## 2022-04-02 DIAGNOSIS — E875 Hyperkalemia: Secondary | ICD-10-CM

## 2022-04-02 LAB — BASIC METABOLIC PANEL
BUN/Creatinine Ratio: 13 (ref 9–20)
BUN: 12 mg/dL (ref 6–24)
CO2: 22 mmol/L (ref 20–29)
Calcium: 10 mg/dL (ref 8.7–10.2)
Chloride: 108 mmol/L — ABNORMAL HIGH (ref 96–106)
Creatinine, Ser: 0.96 mg/dL (ref 0.76–1.27)
Glucose: 199 mg/dL — ABNORMAL HIGH (ref 70–99)
Potassium: 5.3 mmol/L — ABNORMAL HIGH (ref 3.5–5.2)
Sodium: 143 mmol/L (ref 134–144)
eGFR: 92 mL/min/{1.73_m2} (ref >59)

## 2022-04-08 ENCOUNTER — Ambulatory Visit: Payer: Self-pay | Admitting: Family Medicine

## 2022-04-22 ENCOUNTER — Ambulatory Visit: Payer: Self-pay | Attending: Family Medicine

## 2022-04-22 DIAGNOSIS — E875 Hyperkalemia: Secondary | ICD-10-CM

## 2022-04-23 LAB — POTASSIUM: Potassium: 4.3 mmol/L (ref 3.5–5.2)

## 2022-04-27 ENCOUNTER — Other Ambulatory Visit: Payer: Self-pay | Admitting: Family Medicine

## 2022-04-27 DIAGNOSIS — E1159 Type 2 diabetes mellitus with other circulatory complications: Secondary | ICD-10-CM

## 2022-05-16 ENCOUNTER — Ambulatory Visit: Payer: Self-pay | Attending: Family Medicine

## 2022-07-11 ENCOUNTER — Ambulatory Visit: Payer: Self-pay | Admitting: Family Medicine

## 2022-07-29 ENCOUNTER — Other Ambulatory Visit: Payer: Self-pay

## 2022-07-29 ENCOUNTER — Ambulatory Visit: Payer: Self-pay | Attending: Family Medicine | Admitting: Family Medicine

## 2022-07-29 ENCOUNTER — Encounter: Payer: Self-pay | Admitting: Family Medicine

## 2022-07-29 VITALS — BP 118/77 | HR 86 | Temp 98.2°F | Ht 67.0 in | Wt 172.0 lb

## 2022-07-29 DIAGNOSIS — K648 Other hemorrhoids: Secondary | ICD-10-CM

## 2022-07-29 DIAGNOSIS — E1142 Type 2 diabetes mellitus with diabetic polyneuropathy: Secondary | ICD-10-CM

## 2022-07-29 DIAGNOSIS — E1169 Type 2 diabetes mellitus with other specified complication: Secondary | ICD-10-CM

## 2022-07-29 DIAGNOSIS — E1140 Type 2 diabetes mellitus with diabetic neuropathy, unspecified: Secondary | ICD-10-CM | POA: Insufficient documentation

## 2022-07-29 DIAGNOSIS — Z1211 Encounter for screening for malignant neoplasm of colon: Secondary | ICD-10-CM

## 2022-07-29 LAB — POCT GLYCOSYLATED HEMOGLOBIN (HGB A1C): HbA1c, POC (controlled diabetic range): 10 % — AB (ref 0.0–7.0)

## 2022-07-29 LAB — GLUCOSE, POCT (MANUAL RESULT ENTRY): POC Glucose: 152 mg/dl — AB (ref 70–99)

## 2022-07-29 MED ORDER — DULOXETINE HCL 60 MG PO CPEP: 60.0000 mg | ORAL_CAPSULE | Freq: Every day | ORAL | 1 refills | Status: DC

## 2022-07-29 MED ORDER — DULOXETINE HCL 60 MG PO CPEP
60.0000 mg | ORAL_CAPSULE | Freq: Every day | ORAL | 1 refills | Status: DC
  Filled 2022-07-29: qty 30, 30d supply, fill #0

## 2022-07-29 MED ORDER — PIOGLITAZONE HCL 15 MG PO TABS
15.0000 mg | ORAL_TABLET | Freq: Every day | ORAL | 1 refills | Status: DC
  Filled 2022-07-29: qty 30, 30d supply, fill #0

## 2022-07-29 MED ORDER — PIOGLITAZONE HCL 15 MG PO TABS: 15.0000 mg | ORAL_TABLET | Freq: Every day | ORAL | 1 refills | Status: DC

## 2022-07-29 NOTE — Progress Notes (Signed)
Hemorrhoids

## 2022-07-29 NOTE — Progress Notes (Signed)
Subjective:  Patient ID: Clayton Farrell, male    DOB: May 07, 1965  Age: 57 y.o. MRN: [id number]  CC: Diabetes   HPI Clayton Farrell is a 57 y.o. year old male with a history of type 2 diabetes mellitus (A1c 11.4), hypertension.   Interval History: Discussed the use of AI scribe software for clinical note transcription with the patient, who gave verbal consent to proceed.   The patient presents for follow-up of hemorrhoids. He reports no pain or bleeding, but finds the presence of the hemorrhoids to be 'annoying.' He has been prescribed a cream, but has not been using it due to lack of symptoms. He expresses interest in surgical management.  The patient also has poorly controlled diabetes with a current A1c of 10.0, down from 11.4. He has been taking his glipizide and metformin but it is not adequately controlling his blood sugar.  He is not open to GLP-1 RA or the possibility of starting insulin injections.  The patient also reports possible neuropathy with numbness in the toes. He is open to starting medication for this.  He is unsure of the dose of his amlodipine medication as he has put 2.5 and 5 mg His med list for hypertension but I have checked with the pharmacy and confirmed he is on 5 mg.       Past Medical History:  Diagnosis Date   Diabetes mellitus without complication (HCC)     History reviewed. No pertinent surgical history.  Family History  Problem Relation Age of Onset   Diabetes Mother    Diabetes Sister    Diabetes Brother     Social History   Socioeconomic History   Marital status: Single    Spouse name: Not on file   Number of children: Not on file   Years of education: Not on file   Highest education level: Not on file  Occupational History   Not on file  Tobacco Use   Smoking status: Former    Current packs/day: 0.00    Types: Cigarettes    Quit date: 01/31/2008    Years since quitting: 14.5   Smokeless tobacco: Never  Substance and Sexual  Activity   Alcohol use: No   Drug use: No   Sexual activity: Not on file  Other Topics Concern   Not on file  Social History Narrative   Not on file   Social Determinants of Health   Financial Resource Strain: Not on file  Food Insecurity: Not on file  Transportation Needs: Not on file  Physical Activity: Not on file  Stress: Not on file  Social Connections: Not on file    No Known Allergies  Outpatient Medications Prior to Visit  Medication Sig Dispense Refill   atorvastatin (LIPITOR) 20 MG tablet Take 1 tablet (20 mg total) by mouth daily. 90 tablet 1   glipiZIDE (GLUCOTROL) 10 MG tablet Take 1 tablet (10 mg total) by mouth 2 (two) times daily. 180 tablet 1   lisinopril (ZESTRIL) 20 MG tablet Take 1 tablet (20 mg total) by mouth daily. 90 tablet 1   metFORMIN (GLUCOPHAGE) 1000 MG tablet Take 1 tablet (1,000 mg total) by mouth 2 (two) times daily with a meal. 360 tablet 1   naproxen (NAPROSYN) 500 MG tablet Take 500 mg by mouth 2 (two) times daily.     polyethylene glycol powder (GAVILAX) 17 GM/SCOOP powder TAKE ONE CAPFUL (17 GRAMS) BY MOUTH DAILY. 510 g 0   pramoxine-hydrocortisone (ANALPRAM-HC) 1-1 %  rectal cream Place 1 Application rectally 2 (two) times daily. 30 g 2   amLODipine (NORVASC) 5 MG tablet TAKE 1 TABLET (5 MG TOTAL) BY MOUTH DAILY. 30 tablet 6   amLODipine (NORVASC) 2.5 MG tablet Take 1 tablet (2.5 mg total) by mouth daily. 90 tablet 1   No facility-administered medications prior to visit.     ROS Review of Systems  Constitutional:  Negative for activity change and appetite change.  HENT:  Negative for sinus pressure and sore throat.   Respiratory:  Negative for chest tightness, shortness of breath and wheezing.   Cardiovascular:  Negative for chest pain and palpitations.  Gastrointestinal:  Negative for abdominal distention, abdominal pain and constipation.  Genitourinary: Negative.   Musculoskeletal: Negative.   Neurological:  Positive for numbness.   Psychiatric/Behavioral:  Negative for behavioral problems and dysphoric mood.     Objective:  BP 118/77   Pulse 86   Temp 98.2 F (36.8 C) (Oral)   Ht 5\' 7"  (1.702 m)   Wt 172 lb (78 kg)   SpO2 98%   BMI 26.94 kg/m      07/29/2022    9:06 AM 04/01/2022    2:08 PM 10/15/2021    9:03 AM  BP/Weight  Systolic BP 118 99 147  Diastolic BP 77 65 90  Wt. (Lbs) 172 164.2 165  BMI 26.94 kg/m2 25.72 kg/m2 25.84 kg/m2      Physical Exam Constitutional:      Appearance: He is well-developed.  Cardiovascular:     Rate and Rhythm: Normal rate.     Heart sounds: Normal heart sounds. No murmur heard. Pulmonary:     Effort: Pulmonary effort is normal.     Breath sounds: Normal breath sounds. No wheezing or rales.  Chest:     Chest wall: No tenderness.  Abdominal:     General: Bowel sounds are normal. There is no distension.     Palpations: Abdomen is soft. There is no mass.     Tenderness: There is no abdominal tenderness.  Musculoskeletal:        General: Normal range of motion.     Right lower leg: No edema.     Left lower leg: No edema.  Neurological:     Mental Status: He is alert and oriented to person, place, and time.  Psychiatric:        Mood and Affect: Mood normal.        Latest Ref Rng & Units 04/22/2022    8:36 AM 04/01/2022    3:04 PM 10/15/2021   10:15 AM  CMP  Glucose 70 - 99 mg/dL  474  259   BUN 6 - 24 mg/dL  12  22   Creatinine 5.63 - 1.27 mg/dL  8.75  6.43   Sodium 329 - 144 mmol/L  143  144   Potassium 3.5 - 5.2 mmol/L 4.3  5.3  4.6   Chloride 96 - 106 mmol/L  108  108   CO2 20 - 29 mmol/L  22  20   Calcium 8.7 - 10.2 mg/dL  51.8  9.9   Total Protein 6.0 - 8.5 g/dL   7.9   Total Bilirubin 0.0 - 1.2 mg/dL   0.7   Alkaline Phos 44 - 121 IU/L   98   AST 0 - 40 IU/L   17   ALT 0 - 44 IU/L   23     Lipid Panel     Component Value Date/Time  CHOL 181 10/15/2021 1015   TRIG 107 10/15/2021 1015   HDL 41 10/15/2021 1015   CHOLHDL 3.0 06/25/2013  1024   VLDL 10 06/25/2013 1024   LDLCALC 120 (H) 10/15/2021 1015    CBC    Component Value Date/Time   WBC 11.4 (H) 05/07/2020 0939   RBC 5.31 05/07/2020 0939   HGB 16.5 05/07/2020 0939   HCT 46.8 05/07/2020 0939   PLT 266 05/07/2020 0939   MCV 88.1 05/07/2020 0939   MCH 31.1 05/07/2020 0939   MCHC 35.3 05/07/2020 0939   RDW 11.5 05/07/2020 0939   LYMPHSABS 1.8 05/07/2020 0939   MONOABS 0.8 05/07/2020 0939   EOSABS 0.1 05/07/2020 0939   BASOSABS 0.1 05/07/2020 0939    Lab Results  Component Value Date   HGBA1C 10.0 (A) 07/29/2022    Assessment & Plan:  Hemorrhoids: No pain or bleeding. Patient finds them uncomfortable and is interested in surgical management. -Referral to general surgery for further evaluation and management.  Type 2 Diabetes Mellitus: Poorly controlled with current regimen (A1c 10.0, previously 11.4). Discussed the need for better control to prevent complications such as heart attack, stroke, and kidney damage. Patient declined insulin or GLP-1 RA therapy. -Continue current oral antidiabetic medications. -Provide patient with dietary guidance. -Follow up with pharmacist for medication review and education. -Actos added to regimen  Peripheral Neuropathy: Patient reports mild numbness in toes. Likely secondary to poorly controlled diabetes. -Placed on Cymbalta  Colon Cancer Screening: Patient is of age for screening. -Order stool-based colon cancer screening test.  Hypertension: Unclear dose of Amlodipine (2.5mg  vs 5mg ). -Check with pharmacy to confirm current dose of Amlodipine which happens to be 5 mg Blood pressure is controlled on current regimen -Counseled on blood pressure goal of less than 130/80, low-sodium, DASH diet, medication compliance, 150 minutes of moderate intensity exercise per week. Discussed medication compliance, adverse effects.           Meds ordered this encounter  Medications   DISCONTD: pioglitazone (ACTOS) 15 MG  tablet    Sig: Take 1 tablet (15 mg total) by mouth daily.    Dispense:  90 tablet    Refill:  1   DISCONTD: DULoxetine (CYMBALTA) 60 MG capsule    Sig: Take 1 capsule (60 mg total) by mouth daily. For neuropathy    Dispense:  90 capsule    Refill:  1   DULoxetine (CYMBALTA) 60 MG capsule    Sig: Take 1 capsule (60 mg total) by mouth daily. For neuropathy    Dispense:  90 capsule    Refill:  1   pioglitazone (ACTOS) 15 MG tablet    Sig: Take 1 tablet (15 mg total) by mouth daily.    Dispense:  90 tablet    Refill:  1    Follow-up: Return in about 3 months (around 10/29/2022).       Hoy Register, MD, FAAFP. [facility] [place], [place] [phone]   07/29/2022, 1:05 PM

## 2022-07-29 NOTE — Patient Instructions (Signed)

## 2022-07-31 ENCOUNTER — Telehealth: Payer: Self-pay | Admitting: Pharmacist

## 2022-07-31 NOTE — Telephone Encounter (Signed)
LIBERATE Study  Received referral for patient participation in the LIBERATE CGM Study. Contacted patient to discuss study and confirmed HIPAA identifiers. Confirmed patient was provided the LIBERATE Study Information Sheet and any questions were answered.   Confirmed that patient meets study criteria by having a diagnosis of Type 2 Diabetes, is not currently on insulin, and most recent A1c is >8%.  Patient provided verbal consent to participate in the study. Consent documented in electronic medical record.   - Confirmed that patient has a compatible smart phone to download Eldorado 3 app. ([email or web address]) - Asked to download and create a Josephine Igo account prior to first study visit.  - Scheduled first study visit on 08/20/2022. Confirmed patient has transportation to this appointment.  - Discussed use of MyChart in this study. Confirmed patient has an active MyChart account and is aware of their log in information.  Patient aware of pre-visit questionnaire that will be sent 2 days prior to their scheduled study visit and they will plan to complete before the visit.  Butch Penny, PharmD, Patsy [place], CPP Clinical Pharmacist [facility] [phone]

## 2022-08-05 ENCOUNTER — Other Ambulatory Visit: Payer: Self-pay

## 2022-08-20 ENCOUNTER — Encounter: Payer: Self-pay | Admitting: Pharmacist

## 2022-08-27 ENCOUNTER — Encounter: Payer: Self-pay | Admitting: Pharmacist

## 2022-08-27 ENCOUNTER — Ambulatory Visit: Payer: Self-pay | Attending: Family Medicine | Admitting: Pharmacist

## 2022-08-27 DIAGNOSIS — Z7984 Long term (current) use of oral hypoglycemic drugs: Secondary | ICD-10-CM

## 2022-08-27 DIAGNOSIS — E1169 Type 2 diabetes mellitus with other specified complication: Secondary | ICD-10-CM

## 2022-08-27 LAB — POCT GLYCOSYLATED HEMOGLOBIN (HGB A1C): HbA1c, POC (controlled diabetic range): 9.8 % — AB (ref 0.0–7.0)

## 2022-08-27 NOTE — Research (Signed)
S:     No chief complaint on file.  57 y.o. male who presents for diabetes evaluation, education, and management in the context of the LIBERATE Study.   PMH is significant for T2DM w/ a hx of diabetic neuropathy, HTN.   Patient was referred and last seen by Primary Care Provider, Dr. Alvis Lemmings, on 07/29/2022. A1c at that visit was 10.0% (down from 11.4 prior). A1c today is 9.8%. During his visit with Dr. Alvis Lemmings, pioglitazone was added and pt was referred to me to enroll in LIBERATE. We got verbal consent to participate from Clayton Farrell 07/31/2022. Of note, he is not open to injections. Of note, BP was 118/77 mmHg.   Today, patient arrives in good spirits and presents without any assistance.  Family/Social History:  -Fhx: DM -Tobacco: former smoker (quit in 2010) -Alcohol: none reported   Current diabetes medications include: glipizide 10 mg BID, metformin 1000 mg BID, pioglitazone 15 mg daily Current hypertension medications include: atorvastatin 20 mg daily Current hyperlipidemia medications include: amlodipine 5 mg daily, lisinopril 20 mg daily Patient reports adherence to taking all medications as prescribed. Of note, he only took pioglitazone once and stopped. Thought it was causing dizziness.   Insurance coverage: none  Patient denies hypoglycemic events.  Patient denies nocturia (nighttime urination). Was having polyuria, polydipsia ~ 1 month ago   Patient denies neuropathy (nerve pain). Patient reports visual changes. Patient denies self foot exams.   Patient reported dietary habits:  -In the last month, pt has eliminated sugar-sweetened bevarages (soda)  Patient-reported exercise habits: none reported   O:   ROS  Physical Exam   Lab Results  Component Value Date   HGBA1C 9.8 (A) 08/27/2022  POC A1c Today: 9.8%  There were no vitals filed for this visit.   Lipid Panel     Component Value Date/Time   CHOL 181 10/15/2021 1015   TRIG 107 10/15/2021 1015    HDL 41 10/15/2021 1015   CHOLHDL 3.0 06/25/2013 1024   VLDL 10 06/25/2013 1024   LDLCALC 120 (H) 10/15/2021 1015    Clinical Atherosclerotic Cardiovascular Disease (ASCVD): No  The 10-year ASCVD risk score (Arnett DK, et al., 2019) is: 13.4%   Values used to calculate the score:     Age: 57 years     Sex: Male     Is Non-Hispanic African American: No     Diabetic: Yes     Tobacco smoker: No     Systolic Blood Pressure: 118 mmHg     Is BP treated: Yes     HDL Cholesterol: 41 mg/dL     Total Cholesterol: 181 mg/dL   Patient is participating in a Managed Medicaid Plan: no     A/P:  LIBERATE Study:  -Patient provided verbal consent to participate in the study. Consent documented in electronic medical record.  -Provided education on Libre 3 CGM. Collaborated to ensure Josephine Igo 3 app was downloaded on patient's phone. Educated on how to place sensor every 14 days, patient placed first sensor correctly and verbalized understanding of use, removal, and how to place next sensor. Discussed alarms. 8 sensors provided for a 3 month supply. Educated to contact the office if the sensor falls off early and replacements are needed before their next [employer].    Diabetes longstanding currently above goal but improving. Patient is able to verbalize appropriate hypoglycemia management plan. Medication adherence appears suboptimal. Will have him restart pioglitazone. He is not willing to do injectables but is  giving thought to this. Wishes to restart pioglitazone and continue with metformin and glipizide for now.  -Continued current regimen. -Patient educated on purpose, proper use, and potential adverse effects of pioglitazone.  -Extensively discussed pathophysiology of diabetes, recommended lifestyle interventions, dietary effects on blood sugar control.  -Counseled on s/sx of and management of hypoglycemia.  -Next A1c anticipated 11/24.   Written patient instructions provided. Patient  verbalized understanding of treatment plan.  Total time in face to face counseling 30 minutes.    Follow-up:  Pharmacist in 1 month.  Butch Penny, PharmD, Patsy [place], CPP Clinical Pharmacist [facility] [phone]

## 2022-09-04 ENCOUNTER — Telehealth: Payer: Self-pay | Admitting: Pharmacist

## 2022-09-04 NOTE — Telephone Encounter (Signed)
LIBERATE Study  Patient completed first study visit for the LIBERATE CGM Study. Contacted patient to discuss CGM tolerability. Pt was unavailable so I left a HIPAA-compliant VM with instructions to return my call.   Butch Penny, PharmD, Patsy [place], CPP Clinical Pharmacist [facility] & [facility] [phone]

## 2022-09-28 ENCOUNTER — Other Ambulatory Visit: Payer: Self-pay | Admitting: Family Medicine

## 2022-09-28 DIAGNOSIS — E1159 Type 2 diabetes mellitus with other circulatory complications: Secondary | ICD-10-CM

## 2022-09-30 NOTE — Telephone Encounter (Signed)
Requested Prescriptions  Pending Prescriptions Disp Refills   lisinopril (ZESTRIL) 20 MG tablet [Pharmacy Med Name: LISINOPRIL 20 MG ORAL TABLET] 90 tablet 1    Sig: TAKE 1 TABLET (20 MG TOTAL) BY MOUTH DAILY.     Cardiovascular:  ACE Inhibitors Passed - 09/28/2022 10:04 AM      Passed - Cr in normal range and within 180 days    Creat  Date Value Ref Range Status  06/25/2013 0.80 0.50 - 1.35 mg/dL Final   Creatinine, Ser  Date Value Ref Range Status  04/01/2022 0.96 0.76 - 1.27 mg/dL Final         Passed - K in normal range and within 180 days    Potassium  Date Value Ref Range Status  04/22/2022 4.3 3.5 - 5.2 mmol/L Final         Passed - Patient is not pregnant      Passed - Last BP in normal range    BP Readings from Last 1 Encounters:  07/29/22 118/77         Passed - Valid encounter within last 6 months    Recent Outpatient Visits           2 months ago Type 2 diabetes mellitus with other specified complication, without long-term current use of insulin (HCC)   [facility] [place], Odette Horns, MD   6 months ago Type 2 diabetes mellitus with other specified complication, without long-term current use of insulin (HCC)   [facility] [place], Griswold, MD   11 months ago Type 2 diabetes mellitus with other specified complication, without long-term current use of insulin (HCC)   [place] [facility] Hoy Register, MD   7 years ago Poison ivy dermatitis   [facility], Asencion Partridge, MD   9 years ago Type II or unspecified type diabetes mellitus with unspecified complication, uncontrolled   [facility], Gwenlyn Found, MD       Future Appointments             In 1 week Lois Huxley, Cornelius Moras, RPH-CPP [facility]

## 2022-10-07 ENCOUNTER — Other Ambulatory Visit: Payer: Self-pay

## 2022-10-07 ENCOUNTER — Encounter: Payer: Self-pay | Admitting: Pharmacist

## 2022-10-07 ENCOUNTER — Ambulatory Visit: Payer: Self-pay | Attending: Family Medicine | Admitting: Pharmacist

## 2022-10-07 DIAGNOSIS — E1159 Type 2 diabetes mellitus with other circulatory complications: Secondary | ICD-10-CM

## 2022-10-07 DIAGNOSIS — E1169 Type 2 diabetes mellitus with other specified complication: Secondary | ICD-10-CM

## 2022-10-07 DIAGNOSIS — Z7984 Long term (current) use of oral hypoglycemic drugs: Secondary | ICD-10-CM

## 2022-10-07 DIAGNOSIS — I152 Hypertension secondary to endocrine disorders: Secondary | ICD-10-CM

## 2022-10-07 MED ORDER — AMLODIPINE BESYLATE 5 MG PO TABS
5.0000 mg | ORAL_TABLET | Freq: Every day | ORAL | 1 refills | Status: DC
  Filled 2022-10-07: qty 90, 90d supply, fill #0
  Filled 2022-12-23: qty 90, 90d supply, fill #1

## 2022-10-07 MED ORDER — GLIPIZIDE ER 5 MG PO TB24
5.0000 mg | ORAL_TABLET | Freq: Every day | ORAL | 1 refills | Status: DC
  Filled 2022-10-07: qty 90, 90d supply, fill #0
  Filled 2022-12-23: qty 90, 90d supply, fill #1

## 2022-10-07 MED ORDER — PIOGLITAZONE HCL 15 MG PO TABS
15.0000 mg | ORAL_TABLET | Freq: Every day | ORAL | 1 refills | Status: DC
  Filled 2022-10-07: qty 90, 90d supply, fill #0

## 2022-10-07 MED ORDER — METFORMIN HCL 1000 MG PO TABS
1000.0000 mg | ORAL_TABLET | Freq: Two times a day (BID) | ORAL | 1 refills | Status: DC
  Filled 2022-10-07: qty 180, 90d supply, fill #0
  Filled 2022-12-23: qty 180, 90d supply, fill #1
  Filled 2023-04-01 (×2): qty 180, 90d supply, fill #2
  Filled 2023-07-13: qty 180, 90d supply, fill #3

## 2022-10-07 MED ORDER — LISINOPRIL 20 MG PO TABS
20.0000 mg | ORAL_TABLET | Freq: Every day | ORAL | 1 refills | Status: DC
  Filled 2022-10-07: qty 90, 90d supply, fill #0
  Filled 2022-12-23: qty 90, 90d supply, fill #1

## 2022-10-07 NOTE — Progress Notes (Signed)
S:     No chief complaint on file.  57 y.o. male who presents for diabetes evaluation, education, and management. Patient arrives in good spirits and presents without any assistance.   57 y.o. male who presents for diabetes evaluation, education, and management. Of note, he is also enrolled in our LIBERATE Study. PMH is significant for T2DM w/ a hx of diabetic neuropathy, HTN.   Patient was referred and last seen by Primary Care Provider, Dr. Alvis Lemmings, on 07/29/2022. A1c at that visit was 10.0% (down from 11.4 prior). We saw him on 08/27/2022 and enrolled him in our LIBERATE study.  Patient reports Diabetes was diagnosed is longstanding.   Family/Social History:  -Fhx: DM -Tobacco: former smoker (quit in 2010) -Alcohol: none   Current diabetes medications include: glipizide 10 mg BID (only taking once daily), metformin 1000 mg BID (only taking once daily), pioglitazone 15 mg daily.   Patient reports adherence to taking all medications but differently than prescribed.   Insurance coverage: none  Patient denies hypoglycemic events.  Patient denies nocturia (nighttime urination).  Patient denies neuropathy (nerve pain). Patient denies visual changes. Patient reports self foot exams.   Patient reported dietary habits: Eats 2-3 meals/day -Has incorporated drastic changes into his diet.  -Switched to whole wheat/grain-based (Ezekiel Bread). Has eliminated sugars and sweets.  -Changed from sugar-sweetened beverages to "diet", "Zero"-based beverages   Patient-reported exercise habits:  -None outside of work   O:   Libre3 CGM Download today from 09/24/22 - 10/07/2022 % Time CGM is active: 94% Average Glucose: 143 mg/dL Glucose Management Indicator: 6.7  Glucose Variability: 24.6% (goal <36%) Time in Goal:  - Time in range 70-180: 86% - Time above range: 14% - Time below range: 0% Observed patterns:   Lab Results  Component Value Date   HGBA1C 9.8 (A) 08/27/2022   There  were no vitals filed for this visit.  Lipid Panel     Component Value Date/Time   CHOL 181 10/15/2021 1015   TRIG 107 10/15/2021 1015   HDL 41 10/15/2021 1015   CHOLHDL 3.0 06/25/2013 1024   VLDL 10 06/25/2013 1024   LDLCALC 120 (H) 10/15/2021 1015    Clinical Atherosclerotic Cardiovascular Disease (ASCVD): No  The 10-year ASCVD risk score (Arnett DK, et al., 2019) is: 13.4%   Values used to calculate the score:     Age: 3 years     Sex: Male     Is Non-Hispanic African American: No     Diabetic: Yes     Tobacco smoker: No     Systolic Blood Pressure: 118 mmHg     Is BP treated: Yes     HDL Cholesterol: 41 mg/dL     Total Cholesterol: 181 mg/dL   Patient is participating in a Managed Medicaid Plan: no   A/P: Diabetes longstanding currently uncontrolled based on A1c but home data reveals improvement. Patient is able to verbalize appropriate hypoglycemia management plan. Medication adherence appears to be appropriate. -Discontinued glipizide 10 mg BID.  -Start glipizide 5 mg XL daily in the morning. Can consider stopping at follow-up. -Continue metformin 1000 mg BID. Encouraged patient to take as prescribed.  -Continue pioglitazone 15 mg daily for now.  -Patient educated on purpose, proper use, and potential adverse effects of glipizide, metformin and pioglitazone.  -Extensively discussed pathophysiology of diabetes, recommended lifestyle interventions, dietary effects on blood sugar control.  -Counseled on s/sx of and management of hypoglycemia.  -Next A1c anticipated 11/2022. .  Written  patient instructions provided. Patient verbalized understanding of treatment plan.  Total time in face to face counseling 30 minutes.    Follow-up:  Pharmacist in 1 month.  Butch Penny, PharmD, Patsy [place], CPP Clinical Pharmacist [facility] [phone]

## 2022-10-08 ENCOUNTER — Ambulatory Visit: Payer: Self-pay | Admitting: Pharmacist

## 2022-10-31 ENCOUNTER — Other Ambulatory Visit: Payer: Self-pay | Admitting: Family Medicine

## 2022-10-31 DIAGNOSIS — E1169 Type 2 diabetes mellitus with other specified complication: Secondary | ICD-10-CM

## 2022-11-10 NOTE — Progress Notes (Unsigned)
S:     No chief complaint on file.  57 y.o. male who presents for diabetes evaluation, education, and management. Patient arrives in good spirits and presents without any assistance.   57 y.o. male who presents for diabetes evaluation, education, and management. Of note, he is also enrolled in our LIBERATE Study. PMH is significant for T2DM w/ a hx of diabetic neuropathy, HTN.   Patient was referred and last seen by Primary Care Provider, Dr. Alvis Lemmings, on 07/29/2022. A1c at that visit was 10.0% (down from 11.4 prior). We saw him on 08/27/2022 and enrolled him in our LIBERATE study.  Last pharmacist visit on 9/23. Patient reported taking all medications, but differently than prescribed (glipizide 10 mg once daily instead of twice daily and metformin 1000 mg once daily instead of twice daily). Glipizide was changed to XL 5 mg once daily and Luke encouraged him to take metformin twice a day as prescribed. Discussed discontinuing glipizide at f/u if possible. CGM showed improvement with GMI of 6.7.  Today patient presents to clinic in good spirits. Visit conducted with assistance from interpreter Thorp [phone]. Patient brought all medications with him today. Has not implemented any of the medication changes discussed last visit yet, but did pick up new prescriptions from pharmacy downstairs. Reports he only takes 5 pills every day in the morning including metformin 1000 mg, glipizide 10 mg, amlodipine, lisinopril, and atorvastatin. Although he is out of atorvastatin today. Requested that I write on each prescription bottle what the medication is for. Reports that when his Josephine Igo sensor is due to be changed he will wait to apply new sensor for 12-24 hours to allow time for dietary indiscretion (pizza, cake). Reports he does not like when his sensor shows high readings.   Patient reports Diabetes was diagnosed is longstanding.   Family/Social History:  -Fhx: DM -Tobacco: former smoker (quit in  2010) -Alcohol: none   Current diabetes medications include: glipizide XL 5 mg daily (taking old supply of glipizide 10 mg once daily), metformin 1000 mg BID (only taking once daily), pioglitazone 15 mg daily (not taking)  Patient reports adherence to taking all medications but differently than prescribed.   Insurance coverage: none  Patient reports hypoglycemic events.  Patient denies nocturia (nighttime urination).  Patient denies neuropathy (nerve pain). Patient denies visual changes. Patient reports self foot exams.   Patient reported dietary habits: Eats 2-3 meals/day -Has incorporated drastic changes into his diet.  -Switched to whole wheat/grain-based (Ezekiel Bread). Has eliminated sugars and sweets.  -Changed from sugar-sweetened beverages to "diet", "Zero"-based beverages   Patient-reported exercise habits:  -None outside of work   O:   Libre3 CGM Download today from 10/1-10/28/24 % Time CGM is active: 93% Average Glucose: 124 mg/dL Glucose Management Indicator: 6.3 Glucose Variability: 19.4% (goal <36%) Time in Goal:  - Time in range 70-180: 99% - Time above range: 1% - Time below range: 0% Observed patterns:   Lab Results  Component Value Date   HGBA1C 9.8 (A) 08/27/2022   There were no vitals filed for this visit.  Lipid Panel     Component Value Date/Time   CHOL 181 10/15/2021 1015   TRIG 107 10/15/2021 1015   HDL 41 10/15/2021 1015   CHOLHDL 3.0 06/25/2013 1024   VLDL 10 06/25/2013 1024   LDLCALC 120 (H) 10/15/2021 1015    Clinical Atherosclerotic Cardiovascular Disease (ASCVD): No  The 10-year ASCVD risk score (Arnett DK, et al., 2019) is: 13.4%   Values  used to calculate the score:     Age: 46 years     Sex: Male     Is Non-Hispanic African American: No     Diabetic: Yes     Tobacco smoker: No     Systolic Blood Pressure: 118 mmHg     Is BP treated: Yes     HDL Cholesterol: 41 mg/dL     Total Cholesterol: 181 mg/dL   Patient is  participating in a Managed Medicaid Plan: no   A/P: Diabetes longstanding currently uncontrolled based on A1c but home data shows significant improvement with GMI of 6.3. Patient is able to verbalize appropriate hypoglycemia management plan. Medication adherence appears suboptimal. Instructed patient to implement medication changes as discussed at last visit and wrote indication for each medication on bottle. Also will discontinue pioglitazone since he has not been taking this at home confirmed by no pioglitazone medication bottle with him today. He has also had an episode of hypoglycemia which further supports need to discontinue pioglitazone. Encouraged him to wear [place] sensor all the time and not delay putting on a new sensor. Discussed that carbohydrates are okay in moderation and wearing the sensor will help him see how certain foods affect his BG. Overall, time CGM active is 93% so this is likely not changing his BG control significantly. -Discontinue glipizide 10 mg BID -Discontinue pioglitazone 15 mg daily -Start glipizide 5 mg XL daily in the morning.  -Continue metformin 1000 mg BID. Encouraged patient to take as prescribed.  -Patient educated on purpose, proper use, and potential adverse effects of glipizide and metformin.  -Extensively discussed pathophysiology of diabetes, recommended lifestyle interventions, dietary effects on blood sugar control.  -Counseled on s/sx of and management of hypoglycemia.  -Next A1c anticipated 11/27/2022.  Provided refill of atorvastatin today so that all medications would be on file at pharmacy downstairs and avoid patient confusion with medications.  Written patient instructions provided. Patient verbalized understanding of treatment plan.  Total time in face to face counseling 30 minutes.    Follow-up:  Pharmacist in 1 month.  Jarrett Ables, PharmD PGY-1 Pharmacy Resident

## 2022-11-11 ENCOUNTER — Encounter: Payer: Self-pay | Admitting: Pharmacist

## 2022-11-11 ENCOUNTER — Other Ambulatory Visit: Payer: Self-pay

## 2022-11-11 ENCOUNTER — Ambulatory Visit: Payer: Self-pay | Attending: Family Medicine | Admitting: Pharmacist

## 2022-11-11 DIAGNOSIS — Z7984 Long term (current) use of oral hypoglycemic drugs: Secondary | ICD-10-CM

## 2022-11-11 DIAGNOSIS — E1169 Type 2 diabetes mellitus with other specified complication: Secondary | ICD-10-CM

## 2022-11-11 MED ORDER — ATORVASTATIN CALCIUM 20 MG PO TABS
20.0000 mg | ORAL_TABLET | Freq: Every day | ORAL | 1 refills | Status: DC
  Filled 2022-11-11: qty 90, 90d supply, fill #0
  Filled 2022-12-23: qty 90, 90d supply, fill #1

## 2022-12-23 ENCOUNTER — Ambulatory Visit: Payer: Self-pay | Attending: Family Medicine | Admitting: Pharmacist

## 2022-12-23 ENCOUNTER — Other Ambulatory Visit: Payer: Self-pay

## 2022-12-23 ENCOUNTER — Encounter: Payer: Self-pay | Admitting: Pharmacist

## 2022-12-23 DIAGNOSIS — Z7984 Long term (current) use of oral hypoglycemic drugs: Secondary | ICD-10-CM

## 2022-12-23 DIAGNOSIS — E1169 Type 2 diabetes mellitus with other specified complication: Secondary | ICD-10-CM

## 2022-12-23 LAB — POCT GLYCOSYLATED HEMOGLOBIN (HGB A1C): HbA1c, POC (controlled diabetic range): 5.6 % (ref 0.0–7.0)

## 2022-12-23 NOTE — Progress Notes (Signed)
S:     No chief complaint on file.  57 y.o. male who presents for diabetes evaluation, education, and management in the context of the LIBERATE Study.  PMH is significant for T2DM w/ a hx of diabetic neuropathy, HTN.   Patient was referred and last seen by Primary Care Provider, Dr. Alvis Lemmings, on 07/29/2022. Pharmacy last saw him on 11/11/2022. Today is his 37-month follow-up for LIBERATE.   Today, patient arrives in good spirits and presents without any assistance.   Family/Social History:  -Fhx: DM -Tobacco: former smoker (quit in 2010) -Alcohol: none   Current diabetes medications include: glipizide 5 mg XL daily, metformin 1000 mg BID Current hypertension medications include: amlodipine 5 mg daily, lisinopril 20 mg daily Current hyperlipidemia medications include: atorvastatin 20 mg daily   Patient reports adherence to taking all medications as prescribed.   Insurance coverage: self pay  Patient denies hypoglycemic events.  CGM Study Study visit: 3 Month Follow Up Visit  CGM Data Download date: 9/11 - 12/23/2022 % Time CGM Is Active: 96 % Average glucose (mg/dL): 474 mg/dL Glucose Management Indicator (%): 6.3 % Glucose Variability (%): 23.1 % Time Above Range >180 mg/dL (%): 3 % Time in Range 70-180 mg/dL (%): 96 % Time Below Range <70 mg/dL (%): 1 %  Diabetes Distress Scale Feeling like diabetes is taking up too much of my mental and physical energy every day.: Not a problem Feeling that my doctor doesn't know enough about diabetes and diabetic care. : Not a problem Feeling angry, scared, and/or depressed when I think about living with diabetes : Not a problem Feeling that my doctor doesn't give my clear directions on how to manage my diabetes. : Not a problem Feeling that im not testing my blood sugars frequently enough.: Not a problem Feeling that I'm often failing with my diabetes routine: Not a problem Feelling that friends and family are not supportive enough  of self care efforts.: Not a problem Feeling that diabetes controls my life.: Not a problem Feeling that my doctor doesn't take my concerns seriously enough: Not a problem Not feeling confident in my day to day ability to manage diabetes: Not a problem Feeling that I will end up with serious long term complications no matter what I do.: Not a problem Feeling that I am not sticking closely enough to a good meal plan.: Not a problem Feeling that friends or family don't appreciate how difficult living with diabetes can be. : Not a problem Feeling overwhelmed by the demands of living with diabetes.: Not a problem Feeling that I don't have a doctor who I can see.: Not a problem Not feeling motivated to keep up my diabetes self management.: Not a problem Feeling that friends or family don't give me the emotional support that I would like. : Not a problem DDS17 Score: 17 Emotional Burden Score: 1 Physician related distress score: 1 Regimen Related Distress score : 1 Interpersonal distress score: 1   Patient denies nocturia (nighttime urination).  Patient denies neuropathy (nerve pain). Patient denies visual changes. Patient reports self foot exams.   O:   ROS  Physical Exam   Lab Results  Component Value Date   HGBA1C 5.6 12/23/2022    There were no vitals filed for this visit.   Lipid Panel     Component Value Date/Time   CHOL 181 10/15/2021 1015   TRIG 107 10/15/2021 1015   HDL 41 10/15/2021 1015   CHOLHDL 3.0 06/25/2013 1024  VLDL 10 06/25/2013 1024   LDLCALC 120 (H) 10/15/2021 1015    Clinical Atherosclerotic Cardiovascular Disease (ASCVD): No  The 10-year ASCVD risk score (Arnett DK, et al., 2019) is: 14.5%   Values used to calculate the score:     Age: 69 years     Sex: Male     Is Non-Hispanic African American: No     Diabetic: Yes     Tobacco smoker: No     Systolic Blood Pressure: 118 mmHg     Is BP treated: Yes     HDL Cholesterol: 41 mg/dL     Total  Cholesterol: 181 mg/dL   Patient is participating in a Managed Medicaid Plan:  No     A/P: LIBERATE Study:  - 8 sensors provided for a 3 month supply. Educated to contact the office if the sensor falls off early and replacements are needed before their next [employer].   Diabetes longstanding currently controlled. Commended him for this! Patient is having rare hypoglycemia but is able to verbalize appropriate hypoglycemia management plan. Medication adherence appears to be optimal. We will discontinue his glipizide at this time.  -Discontinued glipizide. -Continued metformin 1000 mg BID. -Patient educated on purpose, proper use, and potential adverse effects of glipizide.  -Extensively discussed pathophysiology of diabetes, recommended lifestyle interventions, dietary effects on blood sugar control.  -Counseled on s/sx of and management of hypoglycemia.  -Next A1c anticipated 02/2023 for LIBERATE #3.    Written patient instructions provided. Patient verbalized understanding of treatment plan.  Total time in face to face counseling 30 minutes.    Follow-up:  Pharmacist in 1 month and in Feb for LIBERATE 80-month.  Butch Penny, PharmD, Patsy [place], CPP Clinical Pharmacist [facility] & [facility] [phone]

## 2023-01-03 ENCOUNTER — Other Ambulatory Visit: Payer: Self-pay | Admitting: Family Medicine

## 2023-01-03 ENCOUNTER — Other Ambulatory Visit: Payer: Self-pay

## 2023-01-03 MED ORDER — GLIPIZIDE ER 5 MG PO TB24
5.0000 mg | ORAL_TABLET | Freq: Every day | ORAL | 1 refills | Status: DC
  Filled 2023-01-03: qty 90, 90d supply, fill #0

## 2023-01-07 ENCOUNTER — Other Ambulatory Visit: Payer: Self-pay

## 2023-01-10 IMAGING — CT CT HEAD W/O CM
3 series · 15 of 45 positions shown, 18 images · non-contrast
Comparison: None.

CLINICAL DATA: Posterior headache, dizziness for 2 weeks.

EXAM:
CT HEAD WITHOUT CONTRAST
TECHNIQUE: Contiguous axial images were obtained from the base of the skull
through the vertex without intravenous contrast.

[Series 3: head 5.0 h30s · axial · 0.41mm/px · z∈[-137,-22]mm · 9 of 28 slices shown, 12 images]
[im 3/28  brain]
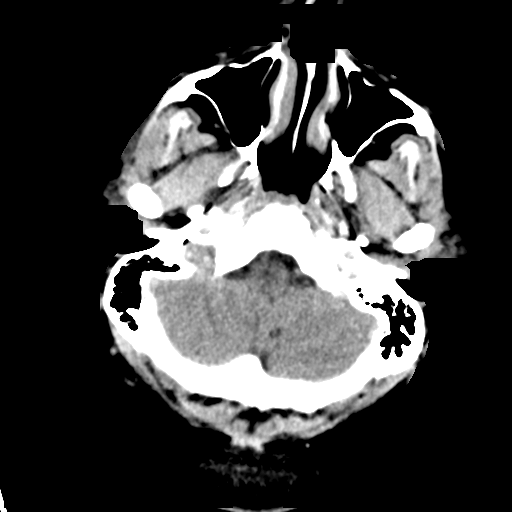
[im 3/28  bone]
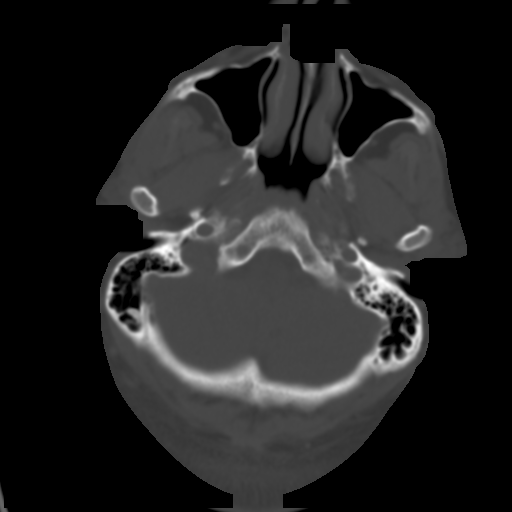
[im 6/28  brain]
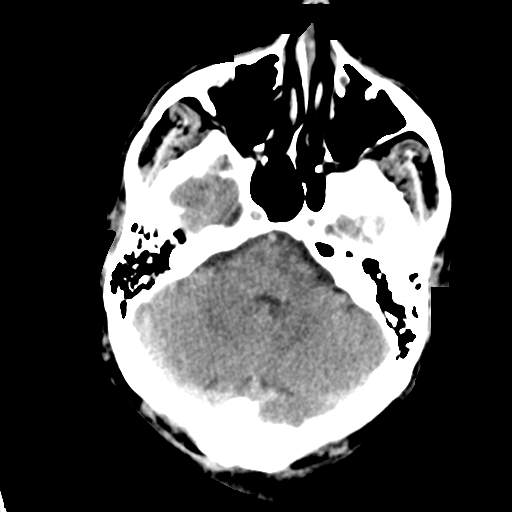
[im 9/28  brain]
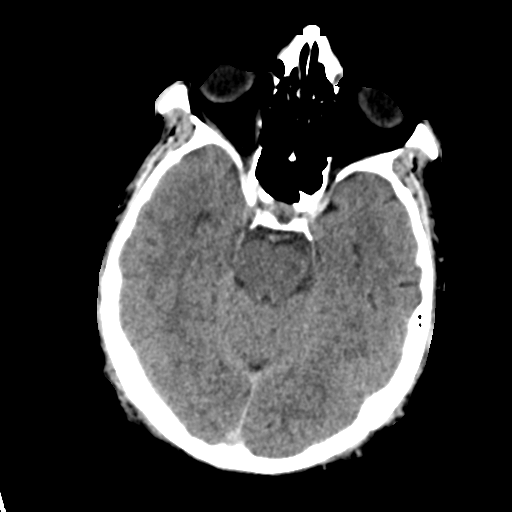
[im 12/28  brain]
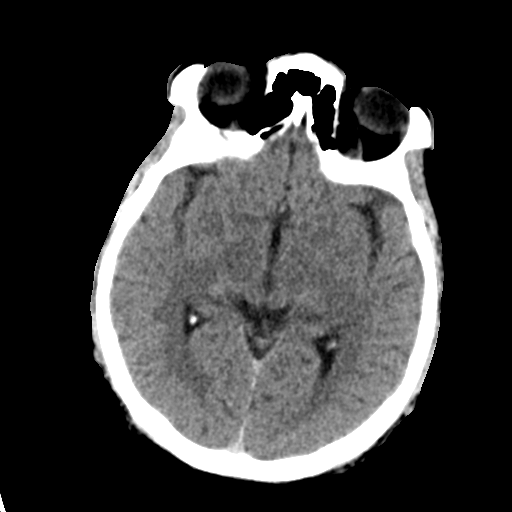
[im 15/28  brain]
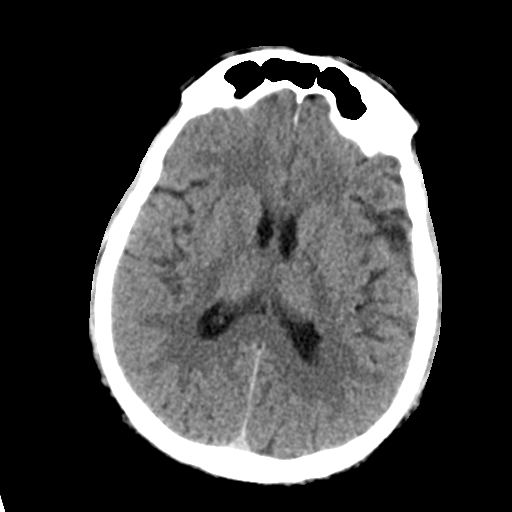
[im 15/28  bone]
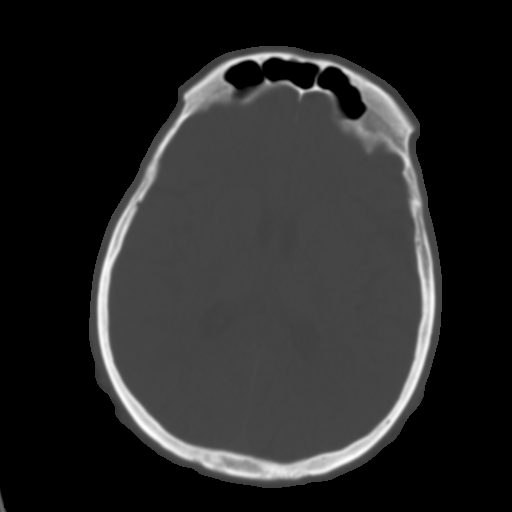
[im 17/28  brain]
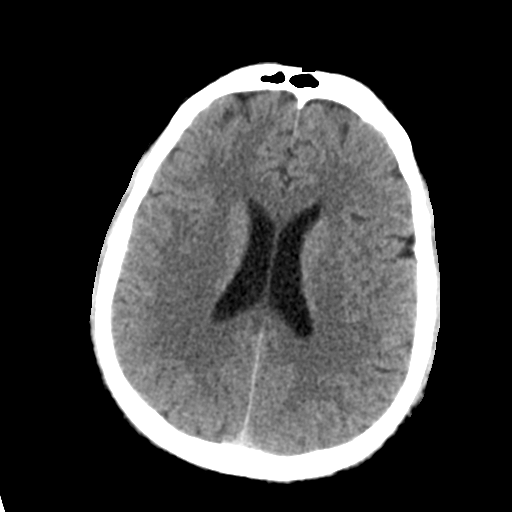
[im 20/28  brain]
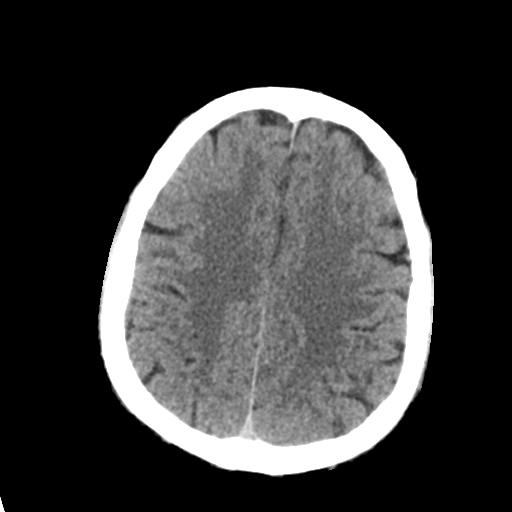
[im 23/28  brain]
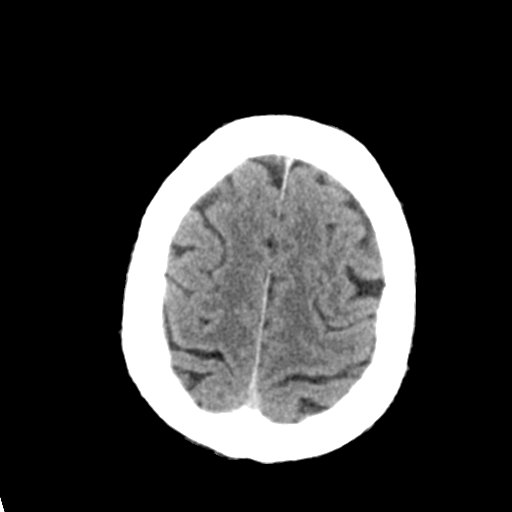
[im 26/28  brain]
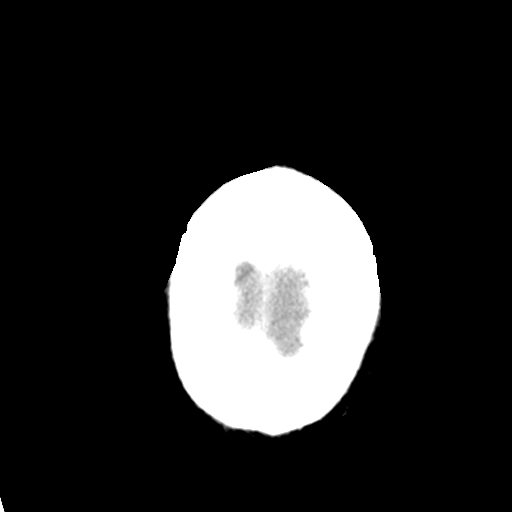
[im 26/28  bone]
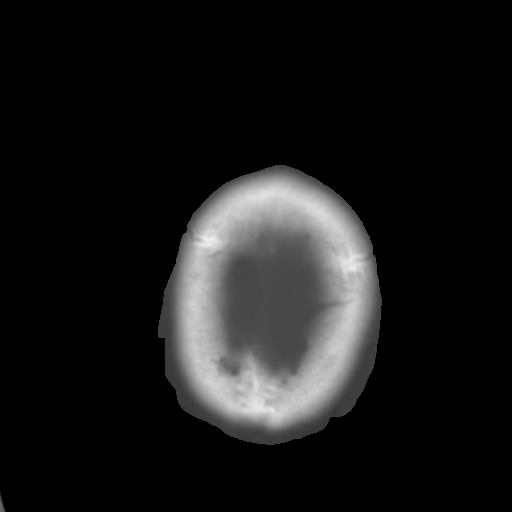

[Series 5: head 3.0 mpr cor · coronal · 0.27mm/px · 3 of 66 slices shown]
[im 22/66  brain]
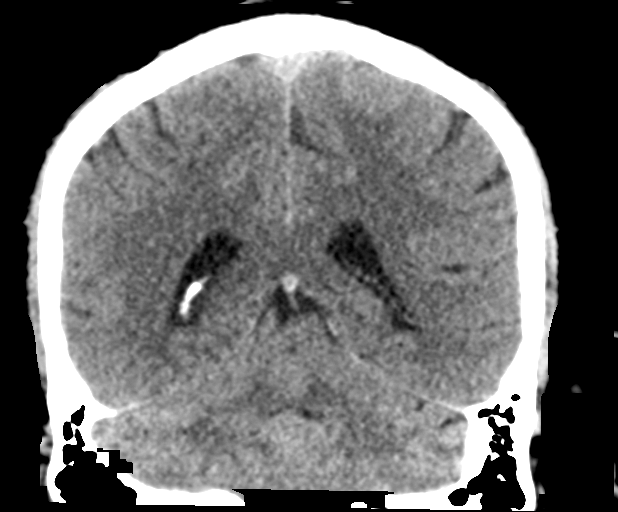
[im 29/66  brain]
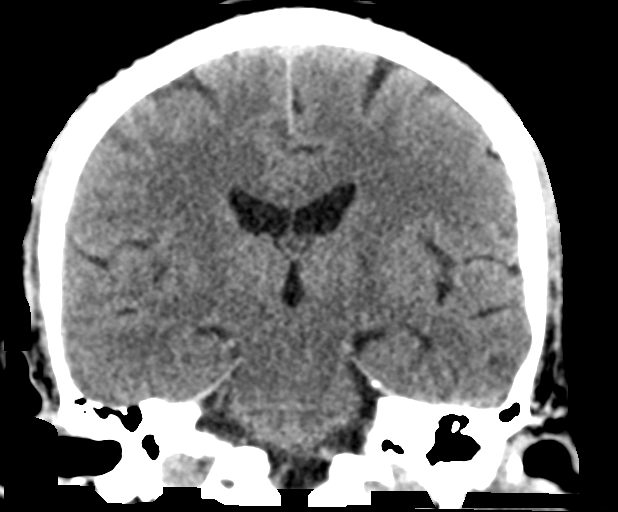
[im 37/66  brain]
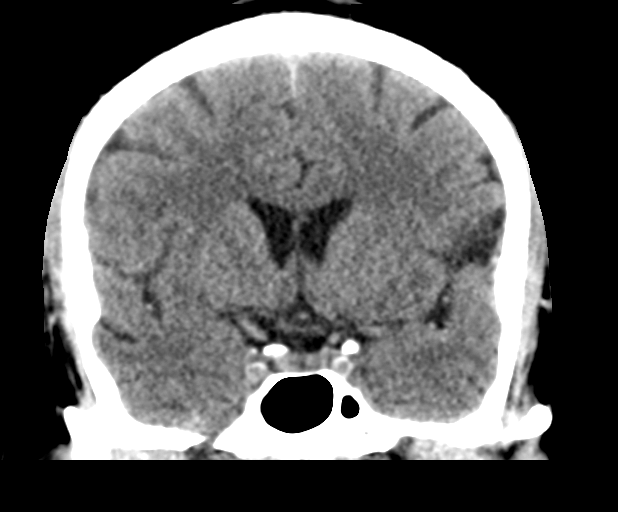

[Series 6: head 3.0 mpr sag · sagittal · 0.27mm/px · 3 of 59 slices shown]
[im 20/59  brain]
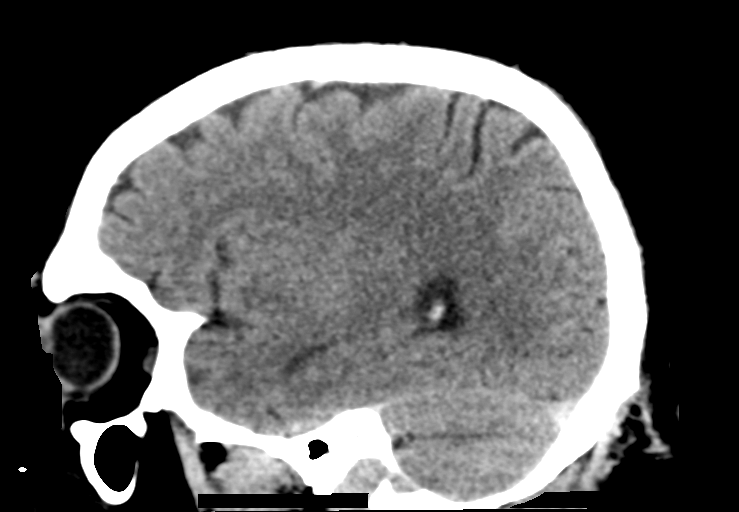
[im 30/59  brain]
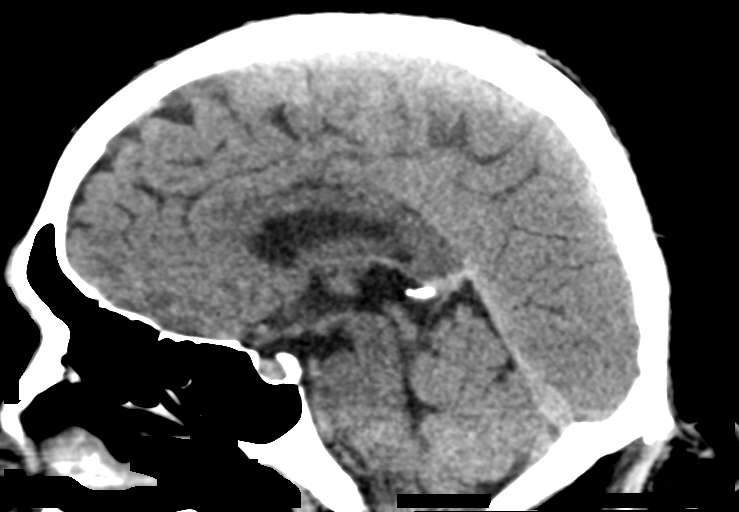
[im 39/59  brain]
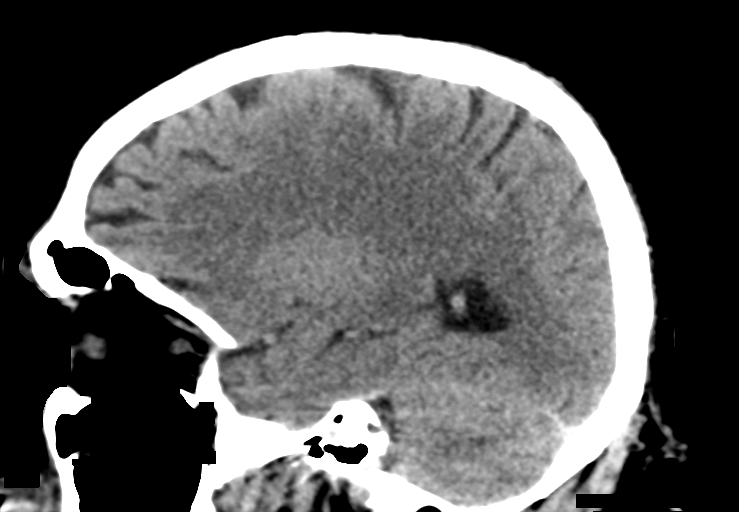

[15 of 45 positions shown; findings below may reference images not displayed]

FINDINGS: Brain: No evidence of acute infarction, hemorrhage, hydrocephalus,
extra-axial collection or mass lesion/mass effect.

Vascular: No hyperdense vessel or unexpected calcification.

Skull: Normal. Negative for fracture or focal lesion.

Sinuses/Orbits: No acute finding.

Other: None.
IMPRESSION: No acute intracranial process.

## 2023-01-27 ENCOUNTER — Ambulatory Visit: Payer: Self-pay | Attending: Family Medicine | Admitting: Pharmacist

## 2023-01-27 ENCOUNTER — Encounter: Payer: Self-pay | Admitting: Pharmacist

## 2023-01-27 ENCOUNTER — Other Ambulatory Visit: Payer: Self-pay

## 2023-01-27 VITALS — BP 121/72 | HR 86

## 2023-01-27 DIAGNOSIS — Z7984 Long term (current) use of oral hypoglycemic drugs: Secondary | ICD-10-CM

## 2023-01-27 DIAGNOSIS — E1169 Type 2 diabetes mellitus with other specified complication: Secondary | ICD-10-CM

## 2023-01-27 MED ORDER — DAPAGLIFLOZIN PROPANEDIOL 10 MG PO TABS
10.0000 mg | ORAL_TABLET | Freq: Every day | ORAL | 3 refills | Status: AC
  Filled 2023-01-27 (×2): qty 30, 30d supply, fill #0
  Filled 2023-02-21 – 2023-04-26 (×3): qty 30, 30d supply, fill #1
  Filled 2023-05-26: qty 14, 14d supply, fill #1
  Filled 2023-07-13 – 2023-11-26 (×6): qty 14, 14d supply, fill #2

## 2023-01-27 NOTE — Progress Notes (Addendum)
 S:     Chief Complaint  Patient presents with   Diabetes Mellitus   Hypertension   Hyperlipidemia        58 y.o. male who presents for diabetes evaluation, education, and management. Patient arrives in good spirits and presents without  any assistance.   Patient was referred and last seen by Primary Care Provider, Dr. Delbert on 07/29/22. PMH is significant for T2DM with neuropathy, HTN, former tobacco use, HLD. At last visit, on 12/23/22 patient was seen for 3 mo f/u for the LIBERATE study. His A1c had decreased to 5.6% (was as high as 11.4% in Mar 2024). Per most recent CGM report, GMI was 6.3%. He was instructed to discontinue his glipizide  and continue metformin  1000 mg PO BID. However, appears that pt filled glipizide  on 01/06/23 after it was requested from and refilled by Dr. Newlin.   Reports that he is doing well today - he tried to stop glipizide  but noticed that his blood sugars were higher so he resumed it. He noticed that his BG would stay around 160-180 after eating for longer periods of time, and he would be waiting to eat until his BG would be back to ~120 mg/dL He asks if it is normal for his BG to increase in the morning despite not eating anything.   Denies side effects with medications. Describes frequent loud digestion sounds, which another provider let him know may be from metformin . He denies any stomach cramping/pain. It is not too bothersome, and he would like to continue metformin  at this time.    Family/Social History:  Family History  Problem Relation Age of Onset   Diabetes Mother    Diabetes Sister    Diabetes Brother     Current diabetes medications include: metformin  1000 mg PO BID, glipizide  5 mg XL PO daily (still taking) Current hypertension medications include: amlodipine  5 mg PO daily, lisinopril  20 mg PO daily Current hyperlipidemia medications include: atorvastatin  20 mg PO daily  Patient reports adherence to taking all medications as  prescribed.   Do you feel that your medications are working for you? yes Have you been experiencing any side effects to the medications prescribed? no Do you have any problems obtaining medications due to transportation or finances? no Insurance coverage: uninsured  Patient denies hypoglycemic events, but reports that he will notice his BG decreasing more rapidly before bed which prompts him to eat something to keep it from going too low.   Patient reports nocturia (nighttime urination) - three times/night (reduced from 20x/night) Patient reports neuropathy (nerve pain) - slight numbness in his toes Patient denies visual changes. Patient reports self foot exams.   Patient reported dietary habits: Eats 2-3 meals/day Breakfast: keto bread, low sugar peanut butter and jam Lunch: eats around 2-3PM Dinner: occasional tacos Snacks: glucerna  Drinks: drinks 5 bottles of water per day in the winter (as much as 10 bottles/day in the summer), occasional diet soda  Patient-reported exercise habits: physically active at work.    O:   ROS  Physical Exam  14 day average blood glucose: 126  Libre3 CGM Download today 01/14/24 - 01/27/23 % Time CGM is active: 92% Average Glucose: 126 mg/dL Glucose Management Indicator: 6.3  Glucose Variability: 21.1% (goal <36%) Time in Goal:  - Time in range 70-180: 97% - Time above range: 3% - Time below range: 0% Observed patterns: very occasional PM hyperglycemia after dinner       Lab Results  Component Value Date  HGBA1C 5.6 12/23/2022   Vitals:   01/27/23 0858  BP: 121/72  Pulse: 86    Lipid Panel     Component Value Date/Time   CHOL 181 10/15/2021 1015   TRIG 107 10/15/2021 1015   HDL 41 10/15/2021 1015   CHOLHDL 3.0 06/25/2013 1024   VLDL 10 06/25/2013 1024   LDLCALC 120 (H) 10/15/2021 1015    Clinical Atherosclerotic Cardiovascular Disease (ASCVD): No  The 10-year ASCVD risk score (Arnett DK, et al., 2019) is: 15.1%    Values used to calculate the score:     Age: 57 years     Sex: Male     Is Non-Hispanic African American: No     Diabetic: Yes     Tobacco smoker: No     Systolic Blood Pressure: 121 mmHg     Is BP treated: Yes     HDL Cholesterol: 41 mg/dL     Total Cholesterol: 181 mg/dL   Patient is participating in a Managed Medicaid Plan: No  A/P: Diabetes longstanding currently controlled with A1c of 5.6% below goal <7%. Patient is  able to verbalize appropriate hypoglycemia management plan. Medication adherence appears optimal, however patient did not discontinue glipizide  as instructed at previous visit. Pt is at risk for hypoglycemia being on sulfonylurea. Would be a good candidate for SGLT2i given previously elevated UACR to 85 mg/g - though due for repeat UACR today.  - Discontinue glipizide  - START Farxiga  (dapagliflozin ) 10 mg PO daily. Will pursue patient assistance via [employer] at [facility]. - Continue metformin  IR 1000 mg PO BID - Ordered UACR today -Patient educated on purpose, proper use, and potential adverse effects of SGLT2i. Instructed to stay hydrated with water throughout the day. Educated to monitor for s/sx of genitourinary infections given mechanism of SGLT2i.   -Extensively discussed pathophysiology of diabetes, recommended lifestyle interventions, dietary effects on blood sugar control.  -Counseled on s/sx of and management of hypoglycemia.  -Next A1c anticipated 03/23/23.   ASCVD risk - primary prevention in patient with diabetes. Last LDL is 120 mg/dL not at goal of <29  mg/dL, though LDL-C has not been rechecked since starting atorvastatin . ASCVD risk factors include DM, previous tobacco use and 10-year ASCVD risk score of 14.5%. Moderate to high intensity statin indicated.  -Continued statin atorvastatin  20 mg PO daily. - Ordered repeat lipid panel today   Hypertension longstanding  currently well controlled below goal <130/80 mmHg. Medication adherence  optimal.  -Continued amlodipine  5 mg PO daily, lisinopril  20 mg PO daily.  Written patient instructions provided. Patient verbalized understanding of treatment plan.  Total time in face to face counseling 30 minutes.    Follow-up:  Pharmacist 1 mo 02/24/23 PCP clinic - needs to be scheduled  Lorain Baseman, PharmD PGY1 Pharmacy Resident

## 2023-01-29 LAB — LIPID PANEL
Chol/HDL Ratio: 3.1 {ratio} (ref 0.0–5.0)
Cholesterol, Total: 116 mg/dL (ref 100–199)
HDL: 38 mg/dL — ABNORMAL LOW (ref >39)
LDL Chol Calc (NIH): 64 mg/dL (ref 0–99)
Triglycerides: 65 mg/dL (ref 0–149)
VLDL Cholesterol Cal: 14 mg/dL (ref 5–40)

## 2023-01-29 LAB — MICROALBUMIN / CREATININE URINE RATIO
Creatinine, Urine: 80.9 mg/dL
Microalb/Creat Ratio: 63 mg/g{creat} — ABNORMAL HIGH (ref 0–29)
Microalbumin, Urine: 50.6 ug/mL

## 2023-02-06 ENCOUNTER — Other Ambulatory Visit: Payer: Self-pay

## 2023-02-11 ENCOUNTER — Other Ambulatory Visit: Payer: Self-pay

## 2023-02-19 ENCOUNTER — Other Ambulatory Visit: Payer: Self-pay

## 2023-02-19 ENCOUNTER — Encounter: Payer: Self-pay | Admitting: Family Medicine

## 2023-02-19 ENCOUNTER — Other Ambulatory Visit (HOSPITAL_COMMUNITY): Payer: Self-pay

## 2023-02-19 ENCOUNTER — Ambulatory Visit: Payer: Self-pay | Attending: Family Medicine | Admitting: Family Medicine

## 2023-02-19 VITALS — BP 108/68 | HR 86 | Ht 67.0 in | Wt 170.4 lb

## 2023-02-19 DIAGNOSIS — E1142 Type 2 diabetes mellitus with diabetic polyneuropathy: Secondary | ICD-10-CM

## 2023-02-19 DIAGNOSIS — R809 Proteinuria, unspecified: Secondary | ICD-10-CM

## 2023-02-19 DIAGNOSIS — E1159 Type 2 diabetes mellitus with other circulatory complications: Secondary | ICD-10-CM

## 2023-02-19 DIAGNOSIS — Z7984 Long term (current) use of oral hypoglycemic drugs: Secondary | ICD-10-CM

## 2023-02-19 DIAGNOSIS — Z1211 Encounter for screening for malignant neoplasm of colon: Secondary | ICD-10-CM

## 2023-02-19 DIAGNOSIS — L84 Corns and callosities: Secondary | ICD-10-CM

## 2023-02-19 DIAGNOSIS — I152 Hypertension secondary to endocrine disorders: Secondary | ICD-10-CM

## 2023-02-19 DIAGNOSIS — E1129 Type 2 diabetes mellitus with other diabetic kidney complication: Secondary | ICD-10-CM

## 2023-02-19 MED ORDER — DULOXETINE HCL 60 MG PO CPEP
60.0000 mg | ORAL_CAPSULE | Freq: Every day | ORAL | 3 refills | Status: DC
  Filled 2023-02-19 (×3): qty 30, 30d supply, fill #0

## 2023-02-19 NOTE — Patient Instructions (Signed)
 VISIT SUMMARY:  You came in today for a regular check-up. Your diabetes is well-controlled, and you are currently taking Metformin  and Farxiga . You mentioned numbness in your feet, which is due to neuropathy, and you have a corn on your foot. We also discussed your history of proteinuria and your concerns about kidney health. Your blood pressure was slightly elevated initially but normalized later, and you are taking Atorvastatin  for hyperlipidemia.  YOUR PLAN:  -DIABETES MELLITUS: Diabetes Mellitus is a condition where your blood sugar levels are too high. Your diabetes is well-controlled with an A1c of 5.6. Continue taking Metformin  and Farxiga , and try to eat fruit in moderation and follow a low salt diet.  -HYPERTENSION: Hypertension is high blood pressure. Your blood pressure was slightly elevated at the start of the visit but normalized later. Continue taking your current antihypertensive medication, and we will check your blood pressure again at your next visit.  -HYPERLIPIDEMIA: Hyperlipidemia is having high levels of fats in your blood. You are taking Atorvastatin  for this condition. Continue with your current medication.  -DIABETIC NEUROPATHY: Diabetic Neuropathy is nerve damage caused by diabetes, leading to numbness in your feet. We will consider trying Cymbalta  for your neuropathy and monitor for any dizziness. You will also be referred to a podiatrist for the corn on your foot.  -DIABETIC NEPHROPATHY: Diabetic Nephropathy is kidney damage caused by diabetes. Continue taking Farxiga , which should help with proteinuria. We will repeat a urine test in the future to monitor this condition.  -GENERAL HEALTH MAINTENANCE: We will order blood tests to check your kidney and liver function and refer you for an eye exam due to your diabetes. Please schedule a follow-up visit in six months.  INSTRUCTIONS:  Please schedule a follow-up visit in six months. We will also need to repeat a urine test  in the future to monitor your proteinuria. Additionally, you will be referred to a podiatrist for the corn on your foot and for an eye exam due to your diabetes.

## 2023-02-19 NOTE — Progress Notes (Signed)
 Subjective:  Patient ID: Clayton Farrell, male    DOB: [birth year]  Age: 58 y.o. MRN: [id number]  CC: Medical Management of Chronic Issues   HPI Clayton Farrell is a 58 y.o. year old male with a history of type 2 diabetes mellitus (A1c 5.6), hypertension.     Interval History: Discussed the use of AI scribe software for clinical note transcription with the patient, who gave verbal consent to proceed.  He presents for a regular check-up. He reports feeling nervous about the appointment but is reassured that it is a routine visit. His diabetes is well-controlled, with an A1c of 5.6, and he is currently on metformin  and Farxiga . He denies experiencing any hypoglycemic symptoms such as shaking or sweating.  The patient also reports numbness in his feet, which has been identified as neuropathy. He mentions that gabapentin  which he previously took for neuropathy makes him feel drowsy. He also reports a long-standing issue with a corn on his foot, which he has been managing by cutting it regularly.  In addition, the patient has a history of microalbuminuria due to previously uncontrolled diabetes. He expresses concern about his kidney health but is reassured that his current medication regimen should help manage this issue.        Past Medical History:  Diagnosis Date   Diabetes mellitus without complication (HCC)     No past surgical history on file.  Family History  Problem Relation Age of Onset   Diabetes Mother    Diabetes Sister    Diabetes Brother     Social History   Socioeconomic History   Marital status: Single    Spouse name: Not on file   Number of children: Not on file   Years of education: Not on file   Highest education level: 12th grade  Occupational History   Not on file  Tobacco Use   Smoking status: Former    Current packs/day: 0.00    Types: Cigarettes    Quit date: 01/31/2008    Years since quitting: 15.0   Smokeless tobacco: Never  Substance and  Sexual Activity   Alcohol use: No   Drug use: No   Sexual activity: Not on file  Other Topics Concern   Not on file  Social History Narrative   Not on file   Social Drivers of Health   Financial Resource Strain: Low Risk  (02/19/2023)   Overall Financial Resource Strain (CARDIA)    Difficulty of Paying Living Expenses: Not hard at all  Food Insecurity: No Food Insecurity (02/19/2023)   Hunger Vital Sign    Worried About Running Out of Food in the Last Year: Never true    Ran Out of Food in the Last Year: Never true  Recent Concern: Food Insecurity - Food Insecurity Present (01/26/2023)   Hunger Vital Sign    Worried About Running Out of Food in the Last Year: Sometimes true    Ran Out of Food in the Last Year: Sometimes true  Transportation Needs: No Transportation Needs (02/19/2023)   PRAPARE - Administrator, Civil Service (Medical): No    Lack of Transportation (Non-Medical): No  Physical Activity: Inactive (02/19/2023)   Exercise Vital Sign    Days of Exercise per Week: 0 days    Minutes of Exercise per Session: 0 min  Stress: No Stress Concern Present (02/19/2023)   [employer] of Occupational Health - Occupational Stress Questionnaire    Feeling of Stress : Not at  all  Social Connections: Socially Isolated (02/19/2023)   Social Connection and Isolation Panel [NHANES]    Frequency of Communication with Friends and Family: Twice a week    Frequency of Social Gatherings with Friends and Family: Twice a week    Attends Religious Services: Never    Database Administrator or Organizations: No    Attends Engineer, Structural: Never    Marital Status: Never married    Allergies  Allergen Reactions   Duloxetine  Other (See Comments)    Dizziness    Outpatient Medications Prior to Visit  Medication Sig Dispense Refill   amLODipine  (NORVASC ) 5 MG tablet Take 1 tablet (5 mg total) by mouth daily. 90 tablet 1   atorvastatin  (LIPITOR) 20 MG tablet Take 1 tablet  (20 mg total) by mouth daily. 90 tablet 1   dapagliflozin  propanediol (FARXIGA ) 10 MG TABS tablet Take 1 tablet (10 mg total) by mouth daily before breakfast. 90 tablet 3   lisinopril  (ZESTRIL ) 20 MG tablet Take 1 tablet (20 mg total) by mouth daily. 90 tablet 1   metFORMIN  (GLUCOPHAGE ) 1000 MG tablet Take 1 tablet (1,000 mg total) by mouth 2 (two) times daily with a meal. 360 tablet 1   naproxen (NAPROSYN) 500 MG tablet Take 500 mg by mouth 2 (two) times daily.     polyethylene glycol powder (GAVILAX) 17 GM/SCOOP powder TAKE ONE CAPFUL (17 GRAMS) BY MOUTH DAILY. 510 g 0   pramoxine-hydrocortisone  (ANALPRAM -HC) 1-1 % rectal cream Place 1 Application rectally 2 (two) times daily. 30 g 2   No facility-administered medications prior to visit.     ROS Review of Systems  Constitutional:  Negative for activity change and appetite change.  HENT:  Negative for sinus pressure and sore throat.   Respiratory:  Negative for chest tightness, shortness of breath and wheezing.   Cardiovascular:  Negative for chest pain and palpitations.  Gastrointestinal:  Negative for abdominal distention, abdominal pain and constipation.  Genitourinary: Negative.   Musculoskeletal: Negative.   Psychiatric/Behavioral:  Negative for behavioral problems and dysphoric mood.     Objective:  BP 108/68   Pulse 86   Ht 5' 7 (1.702 m)   Wt 170 lb 6.4 oz (77.3 kg)   SpO2 97%   BMI 26.69 kg/m      02/19/2023   10:38 AM 02/19/2023   10:01 AM 01/27/2023    8:58 AM  BP/Weight  Systolic BP 108 149 121  Diastolic BP 68 73 72  Wt. (Lbs)  170.4   BMI  26.69 kg/m2       Physical Exam Constitutional:      Appearance: He is well-developed.  Cardiovascular:     Rate and Rhythm: Normal rate.     Heart sounds: Normal heart sounds. No murmur heard. Pulmonary:     Effort: Pulmonary effort is normal.     Breath sounds: Normal breath sounds. No wheezing or rales.  Chest:     Chest wall: No tenderness.  Abdominal:      General: Bowel sounds are normal. There is no distension.     Palpations: Abdomen is soft. There is no mass.     Tenderness: There is no abdominal tenderness.  Musculoskeletal:        General: Normal range of motion.     Right lower leg: No edema.     Left lower leg: No edema.  Neurological:     Mental Status: He is alert and oriented to person, place, and  time.  Psychiatric:        Mood and Affect: Mood normal.    Diabetic Foot Exam - Simple   Simple Foot Form Diabetic Foot exam was performed with the following findings: Yes 02/19/2023 10:29 AM  Visual Inspection Sensation Testing Intact to touch and monofilament testing bilaterally: Yes Pulse Check Posterior Tibialis and Dorsalis pulse intact bilaterally: Yes Comments Corn on lateral aspect of left fourth toe.  No ulceration or skin breakdown        Latest Ref Rng & Units 04/22/2022    8:36 AM 04/01/2022    3:04 PM 10/15/2021   10:15 AM  CMP  Glucose 70 - 99 mg/dL  800  769   BUN 6 - 24 mg/dL  12  22   Creatinine 9.23 - 1.27 mg/dL  9.03  9.05   Sodium 865 - 144 mmol/L  143  144   Potassium 3.5 - 5.2 mmol/L 4.3  5.3  4.6   Chloride 96 - 106 mmol/L  108  108   CO2 20 - 29 mmol/L  22  20   Calcium  8.7 - 10.2 mg/dL  89.9  9.9   Total Protein 6.0 - 8.5 g/dL   7.9   Total Bilirubin 0.0 - 1.2 mg/dL   0.7   Alkaline Phos 44 - 121 IU/L   98   AST 0 - 40 IU/L   17   ALT 0 - 44 IU/L   23     Lipid Panel     Component Value Date/Time   CHOL 116 01/27/2023 0910   TRIG 65 01/27/2023 0910   HDL 38 (L) 01/27/2023 0910   CHOLHDL 3.1 01/27/2023 0910   CHOLHDL 3.0 06/25/2013 1024   VLDL 10 06/25/2013 1024   LDLCALC 64 01/27/2023 0910    CBC    Component Value Date/Time   WBC 11.4 (H) 05/07/2020 0939   RBC 5.31 05/07/2020 0939   HGB 16.5 05/07/2020 0939   HCT 46.8 05/07/2020 0939   PLT 266 05/07/2020 0939   MCV 88.1 05/07/2020 0939   MCH 31.1 05/07/2020 0939   MCHC 35.3 05/07/2020 0939   RDW 11.5 05/07/2020 0939    LYMPHSABS 1.8 05/07/2020 0939   MONOABS 0.8 05/07/2020 0939   EOSABS 0.1 05/07/2020 0939   BASOSABS 0.1 05/07/2020 0939    Lab Results  Component Value Date   HGBA1C 5.6 12/23/2022    Assessment & Plan:      Type II Diabetes Mellitus with microalbuminuria Well controlled with A1c of 5.6. No reported hypoglycemic episodes. Currently on Metformin  and Farxiga . -Continue current regimen of Metformin  and Farxiga . -Encourage moderation in fruit consumption and low salt diet. -Continue Farxiga , which should help with proteinuria. -Repeat urine test in the future to monitor proteinuria. Hypertension Blood pressure slightly elevated at the start of the visit, but normalized on repeat measurement. Patient confirmed adherence to antihypertensive medication. -Continue current antihypertensive medication. -Repeat blood pressure measurement at next visit.  Hyperlipidemia Patient confirmed adherence to Atorvastatin . -Continue Atorvastatin .  Diabetic Neuropathy Patient reports numbness in toes and has been previously treated with Gabapentin , which caused excessive drowsiness. Patient also reports a corn on foot. -Consider trial of Cymbalta  for neuropathy, monitor for dizziness. -Refer to podiatrist for corn evaluation and management.    General Health Maintenance -Order blood tests to check kidney and liver function. -Refer for eye exam due to diabetes. -Schedule follow-up visit in six months.          Meds ordered this  encounter  Medications   DULoxetine  (CYMBALTA ) 60 MG capsule    Sig: Take 1 capsule (60 mg total) by mouth daily. For diabetic neuropathy    Dispense:  30 capsule    Refill:  3    Follow-up: Return in about 6 months (around 08/19/2023) for Chronic medical conditions.       Corrina Sabin, MD, FAAFP. [facility] [place], [place] [phone]   02/19/2023, 12:10 PM

## 2023-02-20 ENCOUNTER — Encounter: Payer: Self-pay | Admitting: Family Medicine

## 2023-02-20 ENCOUNTER — Other Ambulatory Visit: Payer: Self-pay | Admitting: Family Medicine

## 2023-02-20 DIAGNOSIS — R7989 Other specified abnormal findings of blood chemistry: Secondary | ICD-10-CM

## 2023-02-20 LAB — CMP14+EGFR
ALT: 81 [IU]/L — ABNORMAL HIGH (ref 0–44)
AST: 46 [IU]/L — ABNORMAL HIGH (ref 0–40)
Albumin: 4.5 g/dL (ref 3.8–4.9)
Alkaline Phosphatase: 77 [IU]/L (ref 44–121)
BUN/Creatinine Ratio: 19 (ref 9–20)
BUN: 17 mg/dL (ref 6–24)
Bilirubin Total: 0.4 mg/dL (ref 0.0–1.2)
CO2: 20 mmol/L (ref 20–29)
Calcium: 9.5 mg/dL (ref 8.7–10.2)
Chloride: 110 mmol/L — ABNORMAL HIGH (ref 96–106)
Creatinine, Ser: 0.88 mg/dL (ref 0.76–1.27)
Globulin, Total: 2.5 g/dL (ref 1.5–4.5)
Glucose: 128 mg/dL — ABNORMAL HIGH (ref 70–99)
Potassium: 4.6 mmol/L (ref 3.5–5.2)
Sodium: 144 mmol/L (ref 134–144)
Total Protein: 7 g/dL (ref 6.0–8.5)
eGFR: 100 mL/min/{1.73_m2} (ref >59)

## 2023-02-21 ENCOUNTER — Other Ambulatory Visit: Payer: Self-pay

## 2023-02-21 NOTE — Progress Notes (Signed)
S:     No chief complaint on file.  58 y.o. male who presents for diabetes evaluation, education, and management in the context of the LIBERATE sutdy.   PMH is significant for T2DM with neuropathy, HTN, former tobacco use, HLD.   Patient was referred and last seen by Primary Care Provider, Dr. Alvis Lemmings on 02/18/22. At that visit, he denied experiencing any hypoglycemic symptoms. Reported numbness in his feet, which has been identified as neuropathy. Reports a long-standing issue with a corn on his foot, which he has been managing by cutting it regularly.   Last pharmacy visit was on 01/27/2023. Urine albumin/creatine at that visit was 63.  At last visit, glipizide was discontinued and dapagliflozin 10 mg was started. Denied hypoglycemic events, but reported BG decreasing more rapidly before bed which prompts him to eat something to keep it from going too low.   Patient arrives in good spirits for his final [place] visit. Expresses interest in continuing sensor. Reports from his previous visit with Dr. Alvis Lemmings on 02/18/22, Dr. Alvis Lemmings said she would prescribe new medication last week for neuropathy. However, patient reports when he went to pick up, duloxetine 60 mg was prescribed again.   Family/Social History:  Family History  Problem Relation Age of Onset   Diabetes Mother    Diabetes Sister    Diabetes Brother    Current diabetes medications include: metformin 1000 mg PO BID, dapagliflozin 10 mg Current hypertension medications include: amlodipine 5 mg PO daily, lisinopril 20 mg PO daily Current hyperlipidemia medications include: atorvastatin 20 mg PO daily  Patient reports adherence to taking all medications as prescribed.   Insurance coverage: uninsured  Patient denies hypoglycemic events.  Patient denies nocturia (nighttime urination).  Patient denies neuropathy (nerve pain). Patient denies visual changes. Patient reports self foot exams.   Patient reported dietary habits: Eats  2-3 meals/day Breakfast: keto bread, butter, and sprinkle of sugar Lunch: beef, chicken, salads, cheese-eats around 2-3PM Dinner: occasional tacos Snacks: glucerna  Drinks: drinks 5 bottles of water per day in the winter (as much as 10 bottles/day in the summer), occasional diet soda  Patient-reported exercise habits: physically active at work.    O:   % Time CGM is active: 96% Average Glucose: 121 mg/dL Glucose Management Indicator: 6.2  Glucose Variability: 21% (goal <36%) Time in Goal:  - Time in range 70-180: 97% - Time above range: 2% - Time below range: 1%   Lab Results  Component Value Date   HGBA1C 5.4 02/24/2023   There were no vitals filed for this visit.   Lipid Panel     Component Value Date/Time   CHOL 116 01/27/2023 0910   TRIG 65 01/27/2023 0910   HDL 38 (L) 01/27/2023 0910   CHOLHDL 3.1 01/27/2023 0910   CHOLHDL 3.0 06/25/2013 1024   VLDL 10 06/25/2013 1024   LDLCALC 64 01/27/2023 0910    Clinical Atherosclerotic Cardiovascular Disease (ASCVD): No  The ASCVD Risk score (Arnett DK, et al., 2019) failed to calculate for the following reasons:   The valid total cholesterol range is 130 to 320 mg/dL   Patient is participating in a Managed Medicaid Plan: No  A/P: Diabetes longstanding currently at goal. A1c is at 5.4 today and he has completed LIBERATE. Total change over 6 months was a decrease of 4.4% (9.8 down to 5.4 today). Patient is able to verbalize appropriate hypoglycemia management plan. Medication adherence appears optimal. Message sent to Dr. Alvis Lemmings to ask about alternative medication  for neuropathy. -Continued metformin 1000 mg BID -Continued dapagliflozin 10 mg -Patient educated on purpose, proper use, and potential adverse effects of SGLT2i. Instructed to stay hydrated with water throughout the day. Educated to monitor for s/sx of genitourinary infections given mechanism of SGLT2i.   -Extensively discussed pathophysiology of diabetes,  recommended lifestyle interventions, dietary effects on blood sugar control.  -Counseled on s/sx of and management of hypoglycemia.  -Next A1c anticipated 5/25.   ASCVD risk - primary prevention in patient with diabetes. Last LDL is 64 mg/dL at goal of <16  mg/dL. ASCVD risk factors include DM, previous tobacco use and 10-year ASCVD risk score of 14.5%. Moderate to high intensity statin indicated.  -Continued statin atorvastatin 20 mg PO daily.  Hypertension longstanding  currently well controlled below goal <130/80 mmHg. Medication adherence optimal.  -Continued amlodipine 5 mg PO daily, lisinopril 20 mg PO daily.  Written patient instructions provided. Patient verbalized understanding of treatment plan.  Total time in face to face counseling 30 minutes.    Follow-up:  Pharmacist 04/21/2023 PCP clinic visit in 08/25/2023  Patient seen with  Clayton Farrell, PharmD Candidate [employer]  Class of 2027  Clayton Farrell, PharmD, [place], CPP Clinical Pharmacist [facility] [place] [phone]

## 2023-02-24 ENCOUNTER — Encounter: Payer: Self-pay | Admitting: Pharmacist

## 2023-02-24 ENCOUNTER — Other Ambulatory Visit: Payer: Self-pay

## 2023-02-24 ENCOUNTER — Ambulatory Visit: Payer: Self-pay | Attending: Nurse Practitioner | Admitting: Pharmacist

## 2023-02-24 DIAGNOSIS — Z7984 Long term (current) use of oral hypoglycemic drugs: Secondary | ICD-10-CM

## 2023-02-24 DIAGNOSIS — E1129 Type 2 diabetes mellitus with other diabetic kidney complication: Secondary | ICD-10-CM

## 2023-02-24 DIAGNOSIS — R809 Proteinuria, unspecified: Secondary | ICD-10-CM

## 2023-02-24 LAB — POCT GLYCOSYLATED HEMOGLOBIN (HGB A1C): HbA1c, POC (controlled diabetic range): 5.4 % (ref 0.0–7.0)

## 2023-02-24 MED ORDER — FREESTYLE LIBRE 3 SENSOR MISC
6 refills | Status: AC
  Filled 2023-02-24 – 2023-04-01 (×2): qty 2, 28d supply, fill #0
  Filled 2023-05-21: qty 2, 30d supply, fill #0
  Filled 2023-05-22: qty 4, 56d supply, fill #0
  Filled 2023-08-19: qty 4, 56d supply, fill #1
  Filled 2023-09-02 – 2023-10-09 (×3): qty 4, 56d supply, fill #2
  Filled 2023-11-10: qty 4, 56d supply, fill #3

## 2023-02-25 LAB — FECAL OCCULT BLOOD, IMMUNOCHEMICAL: Fecal Occult Bld: NEGATIVE

## 2023-02-26 ENCOUNTER — Other Ambulatory Visit: Payer: Self-pay | Admitting: Family Medicine

## 2023-02-26 ENCOUNTER — Ambulatory Visit (INDEPENDENT_AMBULATORY_CARE_PROVIDER_SITE_OTHER): Payer: Self-pay

## 2023-02-26 ENCOUNTER — Encounter: Payer: Self-pay | Admitting: Podiatry

## 2023-02-26 ENCOUNTER — Ambulatory Visit (INDEPENDENT_AMBULATORY_CARE_PROVIDER_SITE_OTHER): Payer: Self-pay | Admitting: Podiatry

## 2023-02-26 ENCOUNTER — Telehealth: Payer: Self-pay | Admitting: Urology

## 2023-02-26 ENCOUNTER — Other Ambulatory Visit: Payer: Self-pay

## 2023-02-26 DIAGNOSIS — M2042 Other hammer toe(s) (acquired), left foot: Secondary | ICD-10-CM

## 2023-02-26 DIAGNOSIS — L84 Corns and callosities: Secondary | ICD-10-CM

## 2023-02-26 MED ORDER — GABAPENTIN 100 MG PO CAPS
100.0000 mg | ORAL_CAPSULE | Freq: Every day | ORAL | 1 refills | Status: DC
Start: 2023-02-26 — End: 2023-06-23
  Filled 2023-02-26 – 2023-04-01 (×3): qty 30, 30d supply, fill #0

## 2023-02-26 NOTE — Telephone Encounter (Signed)
LM for pt to call back when he is ready to schedule his sx with Dr. Charlsie Merles.

## 2023-02-26 NOTE — Progress Notes (Signed)
Subjective:   Patient ID: Clayton Farrell, male   DOB: 58 y.o.   MRN: [id number]   HPI Patient presents with a very painful corn fourth digit left foot that is been present for 30 years.  States its gotten worse over the time he is tried to trim it use medication padded and does have diabetes he showed me his results and A1c around 7.  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       Objective:  Physical Exam Vitals and nursing note reviewed.  Constitutional:      Appearance: He is well-developed.  Pulmonary:     Effort: Pulmonary effort is normal.  Musculoskeletal:        General: Normal range of motion.  Skin:    General: Skin is warm.  Neurological:     Mental Status: He is alert.     Neurovascular status intact muscle strength found to be adequate range of motion adequate with keratotic lesion fourth digit lateral side left rotation fifth digit pressing against it but not sore.  Patient's fourth toes very tender hard to walk on and wear shoe gear.  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  Due to the 30-year history I do think long-term arthroplasty would be best and he wants to get this done and has interpreter with him today who reviewed the consent form we went over line by line and we will do this in the office as financially this will be much better for him.  After extensive review signed consent form understanding risk scheduled for office outpatient surgery with arthroplasty fourth toe to be done

## 2023-03-07 ENCOUNTER — Other Ambulatory Visit: Payer: Self-pay

## 2023-04-01 ENCOUNTER — Other Ambulatory Visit: Payer: Self-pay | Admitting: Family Medicine

## 2023-04-01 ENCOUNTER — Other Ambulatory Visit: Payer: Self-pay

## 2023-04-01 DIAGNOSIS — I152 Hypertension secondary to endocrine disorders: Secondary | ICD-10-CM

## 2023-04-01 MED ORDER — LISINOPRIL 20 MG PO TABS
20.0000 mg | ORAL_TABLET | Freq: Every day | ORAL | 1 refills | Status: DC
  Filled 2023-04-01 – 2023-04-21 (×2): qty 90, 90d supply, fill #0
  Filled 2023-07-13: qty 60, 60d supply, fill #1

## 2023-04-01 MED ORDER — AMLODIPINE BESYLATE 5 MG PO TABS
5.0000 mg | ORAL_TABLET | Freq: Every day | ORAL | 1 refills | Status: DC
  Filled 2023-04-01 – 2023-04-21 (×2): qty 90, 90d supply, fill #0
  Filled 2023-07-13: qty 90, 90d supply, fill #1

## 2023-04-10 ENCOUNTER — Other Ambulatory Visit: Payer: Self-pay

## 2023-04-20 NOTE — Progress Notes (Unsigned)
 S:     No chief complaint on file.  58 y.o. male who presents for diabetes evaluation, education, and management in the context of the LIBERATE sutdy.   PMH is significant for T2DM with neuropathy, HTN, former tobacco use, HLD.   Patient was referred and last seen by Primary Care Provider, Dr. Alvis Lemmings on 02/18/22. At that visit, he denied experiencing any hypoglycemic symptoms. Reported numbness in his feet, which has been identified as neuropathy. Reports a long-standing issue with a corn on his foot, which he has been managing by cutting it regularly.   Last pharmacy visit was on 01/27/2023. Urine albumin/creatine at that visit was 63.  At last visit, glipizide was discontinued and dapagliflozin 10 mg was started. Denied hypoglycemic events, but reported BG decreasing more rapidly before bed which prompts him to eat something to keep it from going too low.   Patient arrives in good spirits for his final [place] visit. Expresses interest in continuing sensor. Reports from his previous visit with Dr. Alvis Lemmings on 02/18/22, Dr. Alvis Lemmings said she would prescribe new medication last week for neuropathy. However, patient reports when he went to pick up, duloxetine 60 mg was prescribed again.   Family/Social History:  Family History  Problem Relation Age of Onset   Diabetes Mother    Diabetes Sister    Diabetes Brother    Current diabetes medications include: metformin 1000 mg PO BID, dapagliflozin 10 mg Current hypertension medications include: amlodipine 5 mg PO daily, lisinopril 20 mg PO daily Current hyperlipidemia medications include: atorvastatin 20 mg PO daily  Patient reports adherence to taking all medications as prescribed.   Insurance coverage: uninsured  Patient denies hypoglycemic events.  Patient denies nocturia (nighttime urination).  Patient denies neuropathy (nerve pain). Patient denies visual changes. Patient reports self foot exams.   Patient reported dietary habits: Eats  2-3 meals/day Breakfast: keto bread, butter, and sprinkle of sugar Lunch: beef, chicken, salads, cheese-eats around 2-3PM Dinner: occasional tacos Snacks: glucerna  Drinks: drinks 5 bottles of water per day in the winter (as much as 10 bottles/day in the summer), occasional diet soda  Patient-reported exercise habits: physically active at work.    O:   % Time CGM is active: 96% Average Glucose: 121 mg/dL Glucose Management Indicator: 6.2  Glucose Variability: 21% (goal <36%) Time in Goal:  - Time in range 70-180: 97% - Time above range: 2% - Time below range: 1%   Lab Results  Component Value Date   HGBA1C 5.4 02/24/2023   There were no vitals filed for this visit.   Lipid Panel     Component Value Date/Time   CHOL 116 01/27/2023 0910   TRIG 65 01/27/2023 0910   HDL 38 (L) 01/27/2023 0910   CHOLHDL 3.1 01/27/2023 0910   CHOLHDL 3.0 06/25/2013 1024   VLDL 10 06/25/2013 1024   LDLCALC 64 01/27/2023 0910    Clinical Atherosclerotic Cardiovascular Disease (ASCVD): No  The ASCVD Risk score (Arnett DK, et al., 2019) failed to calculate for the following reasons:   The valid total cholesterol range is 130 to 320 mg/dL   Patient is participating in a Managed Medicaid Plan: No  A/P: Diabetes longstanding currently at goal. A1c is at 5.4 today and he has completed LIBERATE. Total change over 6 months was a decrease of 4.4% (9.8 down to 5.4 today). Patient is able to verbalize appropriate hypoglycemia management plan. Medication adherence appears optimal. Message sent to Dr. Alvis Lemmings to ask about alternative medication  for neuropathy. -Continued metformin 1000 mg BID -Continued dapagliflozin 10 mg -Patient educated on purpose, proper use, and potential adverse effects of SGLT2i. Instructed to stay hydrated with water throughout the day. Educated to monitor for s/sx of genitourinary infections given mechanism of SGLT2i.   -Extensively discussed pathophysiology of diabetes,  recommended lifestyle interventions, dietary effects on blood sugar control.  -Counseled on s/sx of and management of hypoglycemia.  -Next A1c anticipated 5/25.   ASCVD risk - primary prevention in patient with diabetes. Last LDL is 64 mg/dL at goal of <16  mg/dL. ASCVD risk factors include DM, previous tobacco use and 10-year ASCVD risk score of 14.5%. Moderate to high intensity statin indicated.  -Continued statin atorvastatin 20 mg PO daily.  Hypertension longstanding  currently well controlled below goal <130/80 mmHg. Medication adherence optimal.  -Continued amlodipine 5 mg PO daily, lisinopril 20 mg PO daily.  Written patient instructions provided. Patient verbalized understanding of treatment plan.  Total time in face to face counseling 30 minutes.    Follow-up:  Pharmacist *** PCP clinic visit in 08/25/2023  Nils Pyle, PharmD PGY1 Pharmacy Resident

## 2023-04-21 ENCOUNTER — Ambulatory Visit: Payer: Self-pay | Attending: Family Medicine | Admitting: Pharmacist

## 2023-04-21 ENCOUNTER — Other Ambulatory Visit: Payer: Self-pay

## 2023-04-21 ENCOUNTER — Encounter: Payer: Self-pay | Admitting: Pharmacist

## 2023-04-21 ENCOUNTER — Other Ambulatory Visit: Payer: Self-pay | Admitting: Family Medicine

## 2023-04-21 DIAGNOSIS — E1169 Type 2 diabetes mellitus with other specified complication: Secondary | ICD-10-CM

## 2023-04-21 DIAGNOSIS — R809 Proteinuria, unspecified: Secondary | ICD-10-CM

## 2023-04-21 DIAGNOSIS — I10 Essential (primary) hypertension: Secondary | ICD-10-CM

## 2023-04-21 DIAGNOSIS — E1129 Type 2 diabetes mellitus with other diabetic kidney complication: Secondary | ICD-10-CM

## 2023-04-21 DIAGNOSIS — R7989 Other specified abnormal findings of blood chemistry: Secondary | ICD-10-CM

## 2023-04-21 DIAGNOSIS — E119 Type 2 diabetes mellitus without complications: Secondary | ICD-10-CM

## 2023-04-21 DIAGNOSIS — Z7984 Long term (current) use of oral hypoglycemic drugs: Secondary | ICD-10-CM

## 2023-04-21 MED ORDER — ATORVASTATIN CALCIUM 20 MG PO TABS
20.0000 mg | ORAL_TABLET | Freq: Every day | ORAL | 1 refills | Status: DC
Start: 2023-04-21 — End: 2023-08-25
  Filled 2023-04-21: qty 90, 90d supply, fill #0
  Filled 2023-07-13: qty 90, 90d supply, fill #1

## 2023-04-22 ENCOUNTER — Encounter: Payer: Self-pay | Admitting: Family Medicine

## 2023-04-22 LAB — CMP14+EGFR
ALT: 20 IU/L (ref 0–44)
AST: 14 IU/L (ref 0–40)
Albumin: 4.6 g/dL (ref 3.8–4.9)
Alkaline Phosphatase: 80 IU/L (ref 44–121)
BUN/Creatinine Ratio: 15 (ref 9–20)
BUN: 15 mg/dL (ref 6–24)
Bilirubin Total: 0.4 mg/dL (ref 0.0–1.2)
CO2: 17 mmol/L — ABNORMAL LOW (ref 20–29)
Calcium: 9.5 mg/dL (ref 8.7–10.2)
Chloride: 110 mmol/L — ABNORMAL HIGH (ref 96–106)
Creatinine, Ser: 0.97 mg/dL (ref 0.76–1.27)
Globulin, Total: 2.4 g/dL (ref 1.5–4.5)
Glucose: 170 mg/dL — ABNORMAL HIGH (ref 70–99)
Potassium: 4.9 mmol/L (ref 3.5–5.2)
Sodium: 145 mmol/L — ABNORMAL HIGH (ref 134–144)
Total Protein: 7 g/dL (ref 6.0–8.5)
eGFR: 90 mL/min/{1.73_m2} (ref >59)

## 2023-04-22 LAB — GAMMA GT: GGT: 10 IU/L (ref 0–65)

## 2023-04-28 ENCOUNTER — Other Ambulatory Visit (HOSPITAL_COMMUNITY): Payer: Self-pay

## 2023-04-29 ENCOUNTER — Other Ambulatory Visit (HOSPITAL_COMMUNITY): Payer: Self-pay

## 2023-05-02 ENCOUNTER — Other Ambulatory Visit: Payer: Self-pay

## 2023-05-21 ENCOUNTER — Other Ambulatory Visit: Payer: Self-pay

## 2023-05-22 ENCOUNTER — Other Ambulatory Visit: Payer: Self-pay

## 2023-05-23 ENCOUNTER — Other Ambulatory Visit: Payer: Self-pay

## 2023-05-26 ENCOUNTER — Other Ambulatory Visit: Payer: Self-pay

## 2023-05-26 ENCOUNTER — Other Ambulatory Visit: Payer: Self-pay | Admitting: Pharmacist

## 2023-05-26 MED ORDER — FREESTYLE LIBRE 3 PLUS SENSOR MISC
5 refills | Status: AC
  Filled 2023-05-26: qty 2, 30d supply, fill #0
  Filled 2023-05-27: qty 1, 14d supply, fill #0
  Filled 2023-09-13: qty 5, 75d supply, fill #1
  Filled 2023-11-26: qty 5, 75d supply, fill #2

## 2023-05-27 ENCOUNTER — Other Ambulatory Visit: Payer: Self-pay | Admitting: Family Medicine

## 2023-05-27 ENCOUNTER — Other Ambulatory Visit: Payer: Self-pay

## 2023-05-30 ENCOUNTER — Other Ambulatory Visit: Payer: Self-pay

## 2023-06-22 NOTE — Progress Notes (Signed)
 S:     No chief complaint on file.  58 y.o. male who presents for diabetes evaluation, education, and management. Completed LIBERATE study in Feb 2025.   PMH is significant for T2DM with neuropathy, HTN, former tobacco use, HLD.   Patient was referred and last seen by Primary Care Provider, Dr. Newlin on 02/18/22. At that visit, he denied experiencing any hypoglycemic symptoms. Reported numbness in his feet, which has been identified as neuropathy. Reports a long-standing issue with a corn on his foot, which he has been managing by cutting it regularly - he has since been seen by podiatry on 02/26/23.   Last pharmacy visit was on 04/21/23 for routine visit. At that visit, repeat labs were ordered due to transaminitis, but labs revealed normal LFTs.    Patient arrives in good spirits and without assistance. He has been continuing to use the [employer] 3 sensors. Continues to have very well controlled blood sugars. He reports that he has/ started gabapentin  and states that he has not had any relief with it. He takes it at night and denies drowsiness with taking it. He has good supplies of all his medications at home. Farxiga  has been shipped to his home via [facility].   Family/Social History:  Family History  Problem Relation Age of Onset   Diabetes Mother    Diabetes Sister    Diabetes Brother    Current diabetes medications include: metformin  1000 mg PO BID, dapagliflozin  10 mg Current hypertension medications include: amlodipine  5 mg PO daily, lisinopril  20 mg PO daily Current hyperlipidemia medications include: atorvastatin  20 mg PO daily  Patient reports adherence to taking all medications as prescribed.   Insurance coverage: uninsured  Patient denies hypoglycemic events.  Patient denies nocturia (nighttime urination).  Patient denies neuropathy (nerve pain). Patient denies visual changes. Patient reports self foot exams.   Patient reported dietary habits: Eats 2-3  meals/day Breakfast: keto bread, butter, and sprinkle of sugar Lunch: beef, chicken, salads, cheese-eats around 2-3PM Dinner: occasional tacos Snacks: glucerna, occasional sugar free candy Drinks: drinks 5 bottles of water per day in the winter (as much as 10 bottles/day in the summer), occasional diet soda  Patient-reported exercise habits: physically active at work.   Home BP readings: doesn't measure at home  O:  Download date: 03/26/23-06/23/23 -- 90-day report  % Time CGM is active: 94% Average Glucose: 119 mg/dL Glucose Management Indicator: 6.2  Glucose Variability: 18.4% (goal <36%) Time in Goal:  - Time in range 70-180: 99% - Time above range: 1% - Time below range: 0%  Lab Results  Component Value Date   HGBA1C 5.4 06/23/2023   Vitals:   06/23/23 0907  BP: 117/75  Pulse: 70    UACR 01/27/23: 63 mg/g  Lipid Panel     Component Value Date/Time   CHOL 116 01/27/2023 0910   TRIG 65 01/27/2023 0910   HDL 38 (L) 01/27/2023 0910   CHOLHDL 3.1 01/27/2023 0910   CHOLHDL 3.0 06/25/2013 1024   VLDL 10 06/25/2013 1024   LDLCALC 64 01/27/2023 0910    Clinical Atherosclerotic Cardiovascular Disease (ASCVD): No  The ASCVD Risk score (Arnett DK, et al., 2019) failed to calculate for the following reasons:   The valid total cholesterol range is 130 to 320 mg/dL   Patient is participating in a Managed Medicaid Plan: No  A/P: Diabetes longstanding currently at goal with A1c 5.4% below goal < 7%, and TIR of 99% above goal >70%. Patient is  able to verbalize appropriate hypoglycemia management plan. Medication adherence appears optimal.  -Continued metformin  1000 mg BID -Continued dapagliflozin  10 mg -INCREASE gabapentin  to 100 mg BID -Patient educated on purpose, proper use, and potential adverse effects of SGLT2i. Instructed to stay hydrated with water throughout the day. Educated to monitor for s/sx of genitourinary infections given mechanism of SGLT2i.   -Extensively  discussed pathophysiology of diabetes, recommended lifestyle interventions, dietary effects on blood sugar control.  -Recommend to continue monitoring blood glucose continuously with CGM (Freestyle Libre 3) as long as this is affordable.  -Counseled on s/sx of and management of hypoglycemia.  -Next A1c anticipated 09/2023.   ASCVD risk - primary prevention in patient with diabetes. Last LDL is 64 mg/dL at goal of <16  mg/dL. ASCVD risk factors include DM, previous tobacco use and 10-year ASCVD risk score of 14.5%. Moderate to high intensity statin indicated.  -Continued statin atorvastatin  20 mg PO daily. Refills sent today.   Hypertension longstanding currently well controlled below goal <130/80 mmHg. Medication adherence optimal.  -Continued amlodipine  5 mg PO daily, lisinopril  20 mg PO daily.  Elevated LFTs - patient with elevated transaminases on last CBC in Feb 2025. Dr. Newlin ordered repeat CMP and GGT - Resolved as of 04/21/23 with normal LFTs on labs    Written patient instructions provided. Patient verbalized understanding of treatment plan.  Total time in face to face counseling 20 minutes.    Follow-up:  Pharmacist as needed PCP clinic visit on 08/25/23  Juleen Oakland, PharmD PGY1 Pharmacy Resident

## 2023-06-23 ENCOUNTER — Other Ambulatory Visit: Payer: Self-pay

## 2023-06-23 ENCOUNTER — Ambulatory Visit: Payer: Self-pay | Attending: Family Medicine | Admitting: Pharmacist

## 2023-06-23 ENCOUNTER — Encounter: Payer: Self-pay | Admitting: Pharmacist

## 2023-06-23 VITALS — BP 117/75 | HR 70

## 2023-06-23 DIAGNOSIS — E1142 Type 2 diabetes mellitus with diabetic polyneuropathy: Secondary | ICD-10-CM

## 2023-06-23 DIAGNOSIS — I10 Essential (primary) hypertension: Secondary | ICD-10-CM

## 2023-06-23 DIAGNOSIS — E1169 Type 2 diabetes mellitus with other specified complication: Secondary | ICD-10-CM

## 2023-06-23 DIAGNOSIS — R7989 Other specified abnormal findings of blood chemistry: Secondary | ICD-10-CM

## 2023-06-23 DIAGNOSIS — R809 Proteinuria, unspecified: Secondary | ICD-10-CM

## 2023-06-23 DIAGNOSIS — Z7984 Long term (current) use of oral hypoglycemic drugs: Secondary | ICD-10-CM

## 2023-06-23 LAB — POCT GLYCOSYLATED HEMOGLOBIN (HGB A1C): HbA1c, POC (controlled diabetic range): 5.4 % (ref 0.0–7.0)

## 2023-06-23 MED ORDER — GABAPENTIN 100 MG PO CAPS
100.0000 mg | ORAL_CAPSULE | Freq: Two times a day (BID) | ORAL | 2 refills | Status: DC
  Filled 2023-06-23: qty 60, 30d supply, fill #0
  Filled 2023-07-13 – 2023-07-18 (×2): qty 60, 30d supply, fill #1
  Filled 2023-08-19: qty 60, 30d supply, fill #2

## 2023-07-14 ENCOUNTER — Other Ambulatory Visit: Payer: Self-pay

## 2023-07-16 ENCOUNTER — Other Ambulatory Visit: Payer: Self-pay

## 2023-07-21 ENCOUNTER — Other Ambulatory Visit: Payer: Self-pay

## 2023-07-25 ENCOUNTER — Other Ambulatory Visit: Payer: Self-pay

## 2023-08-19 ENCOUNTER — Other Ambulatory Visit: Payer: Self-pay

## 2023-08-21 ENCOUNTER — Other Ambulatory Visit: Payer: Self-pay

## 2023-08-22 ENCOUNTER — Telehealth: Payer: Self-pay | Admitting: Family Medicine

## 2023-08-22 ENCOUNTER — Other Ambulatory Visit: Payer: Self-pay

## 2023-08-22 NOTE — Telephone Encounter (Signed)
 Pt unconfirmed appt 8/8 lvm

## 2023-08-25 ENCOUNTER — Encounter: Payer: Self-pay | Admitting: Family Medicine

## 2023-08-25 ENCOUNTER — Other Ambulatory Visit: Payer: Self-pay

## 2023-08-25 ENCOUNTER — Ambulatory Visit: Payer: Self-pay | Attending: Family Medicine | Admitting: Family Medicine

## 2023-08-25 VITALS — BP 105/68 | HR 82 | Ht 67.0 in | Wt 164.2 lb

## 2023-08-25 DIAGNOSIS — E1142 Type 2 diabetes mellitus with diabetic polyneuropathy: Secondary | ICD-10-CM

## 2023-08-25 DIAGNOSIS — E1159 Type 2 diabetes mellitus with other circulatory complications: Secondary | ICD-10-CM

## 2023-08-25 DIAGNOSIS — I152 Hypertension secondary to endocrine disorders: Secondary | ICD-10-CM

## 2023-08-25 DIAGNOSIS — H539 Unspecified visual disturbance: Secondary | ICD-10-CM

## 2023-08-25 DIAGNOSIS — Z79899 Other long term (current) drug therapy: Secondary | ICD-10-CM

## 2023-08-25 DIAGNOSIS — E1169 Type 2 diabetes mellitus with other specified complication: Secondary | ICD-10-CM

## 2023-08-25 DIAGNOSIS — Z7984 Long term (current) use of oral hypoglycemic drugs: Secondary | ICD-10-CM

## 2023-08-25 MED ORDER — LISINOPRIL 20 MG PO TABS
20.0000 mg | ORAL_TABLET | Freq: Every day | ORAL | 1 refills | Status: AC
  Filled 2023-08-25 – 2023-09-13 (×2): qty 90, 90d supply, fill #0
  Filled 2024-01-10: qty 90, 90d supply, fill #1

## 2023-08-25 MED ORDER — GABAPENTIN 100 MG PO CAPS
100.0000 mg | ORAL_CAPSULE | Freq: Two times a day (BID) | ORAL | 1 refills | Status: AC
  Filled 2023-08-25 – 2023-09-23 (×2): qty 180, 90d supply, fill #0
  Filled 2023-12-09: qty 180, 90d supply, fill #1

## 2023-08-25 MED ORDER — METFORMIN HCL 1000 MG PO TABS
1000.0000 mg | ORAL_TABLET | Freq: Two times a day (BID) | ORAL | 1 refills | Status: AC
Start: 2023-08-25 — End: ?
  Filled 2023-08-25: qty 360, 180d supply, fill #0
  Filled 2023-09-13 – 2023-10-09 (×2): qty 180, 90d supply, fill #0
  Filled 2024-01-10: qty 180, 90d supply, fill #1

## 2023-08-25 MED ORDER — ATORVASTATIN CALCIUM 20 MG PO TABS
20.0000 mg | ORAL_TABLET | Freq: Every day | ORAL | 1 refills | Status: AC
  Filled 2023-08-25 – 2023-11-08 (×2): qty 90, 90d supply, fill #0

## 2023-08-25 MED ORDER — AMLODIPINE BESYLATE 5 MG PO TABS
5.0000 mg | ORAL_TABLET | Freq: Every day | ORAL | 1 refills | Status: AC
  Filled 2023-08-25 – 2023-11-08 (×2): qty 90, 90d supply, fill #0

## 2023-08-25 NOTE — Patient Instructions (Signed)
 VISIT SUMMARY:  You came in today for a follow-up regarding your visual disturbances and overall health management. We discussed your recent symptoms, reviewed your current medications, and made plans for further care.  YOUR PLAN:  -TYPE 2 DIABETES MELLITUS WITH DIABETIC NEPHROPATHY AND NEUROPATHY: Your diabetes is well-controlled with an A1c of 5.4. Neuropathy, which is nerve damage caused by diabetes, is managed with gabapentin . We will refill your metformin  and ensure you have access to your Freestyle Libre 3 sensors for glucose monitoring.  -HYPERTENSION: Your high blood pressure is well-controlled with your current medications. We will refill your prescriptions for amlodipine  and lisinopril .  -HYPERLIPIDEMIA: Your high cholesterol is managed with atorvastatin . We will refill your prescription for atorvastatin .  -VISUAL DISTURBANCE, RIGHT EYE: You have been experiencing intermittent visual disturbances in your right eye. We recommend scheduling an appointment with a vision specialist to further investigate this issue.  INSTRUCTIONS:  Please schedule an appointment with a vision specialist as soon as possible to address your visual disturbances. Continue taking your medications as prescribed and monitor your blood sugar levels using the Freestyle Libre 3 sensors. Follow up with us  if you experience any new symptoms or have any concerns.

## 2023-08-25 NOTE — Progress Notes (Signed)
 Subjective:  Patient ID: Clayton Farrell, male    DOB: [birth year]  Age: 58 y.o. MRN: [id number]  CC: Medical Management of Chronic Issues (Seeing spots in right eye)     Discussed the use of AI scribe software for clinical note transcription with the patient, who gave verbal consent to proceed.  History of Present Illness Clayton Farrell is a 58 year old male with a history of type 2 diabetes mellitus , hypertension. visual disturbances who presents for follow-up.  He experiences visual disturbances in his right eye for the past two weeks, characterized by spots and a screen-like effect with a prominent black dot. These symptoms are intermittent and change with eye frothing or rubbing, altering the pattern the next day. There is no complete vision blockage. A similar episode occurred two months ago and resolved spontaneously.  Had referred him to ophthalmology but he never followed through with this.  His diabetes is managed with Farxiga  and metformin , and he uses Freestyle Libre 3 sensors for glucose monitoring. He recently refilled these sensors and prefers the older version due to cost. His last A1c was 5.4 in June, indicating good control.  He takes atorvastatin  and gabapentin , with recent medication refills. He works in [employer], involving significant physical activity, though he does not engage in formal exercise.    Past Medical History:  Diagnosis Date   Diabetes mellitus without complication (HCC)     No past surgical history on file.  Family History  Problem Relation Age of Onset   Diabetes Mother    Diabetes Sister    Diabetes Brother     Social History   Socioeconomic History   Marital status: Single    Spouse name: Not on file   Number of children: Not on file   Years of education: Not on file   Highest education level: 9th grade  Occupational History   Not on file  Tobacco Use   Smoking status: Former    Current  packs/day: 0.00    Types: Cigarettes    Quit date: 01/31/2008    Years since quitting: 15.5   Smokeless tobacco: Never  Vaping Use   Vaping status: Never Used  Substance and Sexual Activity   Alcohol use: No   Drug use: No   Sexual activity: Not on file  Other Topics Concern   Not on file  Social History Narrative   Not on file   Social Drivers of Health   Financial Resource Strain: Low Risk  (08/21/2023)   Overall Financial Resource Strain (CARDIA)    Difficulty of Paying Living Expenses: Not very hard  Food Insecurity: No Food Insecurity (08/21/2023)   Hunger Vital Sign    Worried About Running Out of Food in the Last Year: Never true    Ran Out of Food in the Last Year: Never true  Transportation Needs: No Transportation Needs (08/21/2023)   PRAPARE - Administrator, Civil Service (Medical): No    Lack of Transportation (Non-Medical): No  Physical Activity: Inactive (08/21/2023)   Exercise Vital Sign    Days of Exercise per Week: 0 days    Minutes of Exercise per Session: Not on file  Stress: No Stress Concern Present (08/21/2023)   [employer] of Occupational Health - Occupational Stress Questionnaire    Feeling of Stress: Only a little  Social Connections: Unknown (08/21/2023)   Social Connection and Isolation Panel    Frequency of Communication with Friends  and Family: Twice a week    Frequency of Social Gatherings with Friends and Family: Once a week    Attends Religious Services: Never    Database administrator or Organizations: No    Attends Engineer, structural: Not on file    Marital Status: Patient declined    Allergies  Allergen Reactions   Duloxetine  Other (See Comments)    Dizziness    Outpatient Medications Prior to Visit  Medication Sig Dispense Refill   Continuous Glucose Sensor (FREESTYLE LIBRE 3 PLUS SENSOR) MISC Use to check blood sugar continuously throughout the day. Change sensor every 15 days. 2 each 5   dapagliflozin   propanediol (FARXIGA ) 10 MG TABS tablet Take 1 tablet (10 mg total) by mouth daily before breakfast. 90 tablet 3   naproxen (NAPROSYN) 500 MG tablet Take 500 mg by mouth 2 (two) times daily.     polyethylene glycol powder (GAVILAX) 17 GM/SCOOP powder TAKE ONE CAPFUL (17 GRAMS) BY MOUTH DAILY. 510 g 0   pramoxine-hydrocortisone  (ANALPRAM -HC) 1-1 % rectal cream Place 1 Application rectally 2 (two) times daily. 30 g 2   amLODipine  (NORVASC ) 5 MG tablet Take 1 tablet (5 mg total) by mouth daily. 90 tablet 1   atorvastatin  (LIPITOR) 20 MG tablet Take 1 tablet (20 mg total) by mouth daily. 90 tablet 1   gabapentin  (NEURONTIN ) 100 MG capsule Take 1 capsule (100 mg total) by mouth 2 (two) times daily. 60 capsule 2   lisinopril  (ZESTRIL ) 20 MG tablet Take 1 tablet (20 mg total) by mouth daily. 90 tablet 1   metFORMIN  (GLUCOPHAGE ) 1000 MG tablet Take 1 tablet (1,000 mg total) by mouth 2 (two) times daily with a meal. 360 tablet 1   Continuous Glucose Sensor (FREESTYLE LIBRE 3 SENSOR) MISC Place 1 sensor on the skin every 14 days. Use to check glucose continuously 2 each 6   No facility-administered medications prior to visit.     ROS Review of Systems  Constitutional:  Negative for activity change and appetite change.  HENT:  Negative for sinus pressure and sore throat.   Eyes:  Positive for visual disturbance.  Respiratory:  Negative for chest tightness, shortness of breath and wheezing.   Cardiovascular:  Negative for chest pain and palpitations.  Gastrointestinal:  Negative for abdominal distention, abdominal pain and constipation.  Genitourinary: Negative.   Musculoskeletal: Negative.   Psychiatric/Behavioral:  Negative for behavioral problems and dysphoric mood.     Objective:  BP 105/68   Pulse 82   Ht 5' 7 (1.702 m)   Wt 164 lb 3.2 oz (74.5 kg)   SpO2 98%   BMI 25.72 kg/m      08/25/2023    8:42 AM 06/23/2023    9:07 AM 02/19/2023   10:38 AM  BP/Weight  Systolic BP 105 117 108   Diastolic BP 68 75 68  Wt. (Lbs) 164.2    BMI 25.72 kg/m2        Physical Exam Constitutional:      Appearance: He is well-developed.  Cardiovascular:     Rate and Rhythm: Normal rate.     Heart sounds: Normal heart sounds. No murmur heard. Pulmonary:     Effort: Pulmonary effort is normal.     Breath sounds: Normal breath sounds. No wheezing or rales.  Chest:     Chest wall: No tenderness.  Abdominal:     General: Bowel sounds are normal. There is no distension.     Palpations: Abdomen is  soft. There is no mass.     Tenderness: There is no abdominal tenderness.  Musculoskeletal:        General: Normal range of motion.     Right lower leg: No edema.     Left lower leg: No edema.  Neurological:     Mental Status: He is alert and oriented to person, place, and time.  Psychiatric:        Mood and Affect: Mood normal.        Latest Ref Rng & Units 04/21/2023    9:00 AM 02/19/2023   11:35 AM 04/22/2022    8:36 AM  CMP  Glucose 70 - 99 mg/dL 829  871    BUN 6 - 24 mg/dL 15  17    Creatinine 9.23 - 1.27 mg/dL 9.02  9.11    Sodium 865 - 144 mmol/L 145  144    Potassium 3.5 - 5.2 mmol/L 4.9  4.6  4.3   Chloride 96 - 106 mmol/L 110  110    CO2 20 - 29 mmol/L 17  20    Calcium  8.7 - 10.2 mg/dL 9.5  9.5    Total Protein 6.0 - 8.5 g/dL 7.0  7.0    Total Bilirubin 0.0 - 1.2 mg/dL 0.4  0.4    Alkaline Phos 44 - 121 IU/L 80  77    AST 0 - 40 IU/L 14  46    ALT 0 - 44 IU/L 20  81      Lipid Panel     Component Value Date/Time   CHOL 116 01/27/2023 0910   TRIG 65 01/27/2023 0910   HDL 38 (L) 01/27/2023 0910   CHOLHDL 3.1 01/27/2023 0910   CHOLHDL 3.0 06/25/2013 1024   VLDL 10 06/25/2013 1024   LDLCALC 64 01/27/2023 0910    CBC    Component Value Date/Time   WBC 11.4 (H) 05/07/2020 0939   RBC 5.31 05/07/2020 0939   HGB 16.5 05/07/2020 0939   HCT 46.8 05/07/2020 0939   PLT 266 05/07/2020 0939   MCV 88.1 05/07/2020 0939   MCH 31.1 05/07/2020 0939   MCHC 35.3  05/07/2020 0939   RDW 11.5 05/07/2020 0939   LYMPHSABS 1.8 05/07/2020 0939   MONOABS 0.8 05/07/2020 0939   EOSABS 0.1 05/07/2020 0939   BASOSABS 0.1 05/07/2020 0939    Lab Results  Component Value Date   HGBA1C 5.4 06/23/2023       Assessment & Plan Type 2 diabetes mellitus with diabetic neuropathy Diabetes well-controlled with A1c of 5.4. Neuropathy managed with gabapentin . Requires Freestyle Libre 3 sensors for glucose monitoring. - Refill metformin . - Ensure gabapentin  availability. - Confirm Freestyle Libre 3 sensor availability.  Hypertension treated with type 2 diabetes mellitus Hypertension well-controlled with current medications. - Refill amlodipine . - Refill lisinopril . -Counseled on blood pressure goal of less than 130/80, low-sodium, DASH diet, medication compliance, 150 minutes of moderate intensity exercise per week. Discussed medication compliance, adverse effects.   On statin due to increased risk of cardiovascular disease Managed with atorvastatin . - Refill atorvastatin .  Abnormal vision Intermittent visual disturbances in right eye.  I had referred him to ophthalmology in 02/2023 but he never followed up with ophthalmology. Referred to vision specialist. - Encourage scheduling with vision specialist.     Healthcare maintenance Up-to-date on colon cancer screening  Meds ordered this encounter  Medications   amLODipine  (NORVASC ) 5 MG tablet    Sig: Take 1 tablet (5 mg total) by mouth daily.  Dispense:  90 tablet    Refill:  1   atorvastatin  (LIPITOR) 20 MG tablet    Sig: Take 1 tablet (20 mg total) by mouth daily.    Dispense:  90 tablet    Refill:  1   gabapentin  (NEURONTIN ) 100 MG capsule    Sig: Take 1 capsule (100 mg total) by mouth 2 (two) times daily.    Dispense:  180 capsule    Refill:  1   lisinopril  (ZESTRIL ) 20 MG tablet    Sig: Take 1 tablet (20 mg total) by mouth daily.    Dispense:  90 tablet    Refill:  1   metFORMIN   (GLUCOPHAGE ) 1000 MG tablet    Sig: Take 1 tablet (1,000 mg total) by mouth 2 (two) times daily with a meal.    Dispense:  360 tablet    Refill:  1    Follow-up: Return in about 6 months (around 02/25/2024) for Chronic medical conditions.       Corrina Sabin, MD, FAAFP. [facility] [place], [place] [phone]   08/25/2023, 10:38 AM

## 2023-09-03 ENCOUNTER — Other Ambulatory Visit: Payer: Self-pay

## 2023-09-08 ENCOUNTER — Other Ambulatory Visit: Payer: Self-pay

## 2023-09-13 ENCOUNTER — Other Ambulatory Visit: Payer: Self-pay

## 2023-09-16 ENCOUNTER — Other Ambulatory Visit: Payer: Self-pay

## 2023-09-17 ENCOUNTER — Other Ambulatory Visit: Payer: Self-pay

## 2023-09-24 ENCOUNTER — Other Ambulatory Visit: Payer: Self-pay

## 2023-10-02 ENCOUNTER — Other Ambulatory Visit: Payer: Self-pay

## 2023-10-09 ENCOUNTER — Other Ambulatory Visit: Payer: Self-pay

## 2023-11-09 ENCOUNTER — Other Ambulatory Visit: Payer: Self-pay

## 2023-11-10 ENCOUNTER — Other Ambulatory Visit: Payer: Self-pay

## 2023-11-13 ENCOUNTER — Other Ambulatory Visit: Payer: Self-pay

## 2023-11-26 ENCOUNTER — Other Ambulatory Visit: Payer: Self-pay

## 2023-11-28 ENCOUNTER — Other Ambulatory Visit: Payer: Self-pay

## 2023-12-09 ENCOUNTER — Other Ambulatory Visit: Payer: Self-pay

## 2023-12-16 ENCOUNTER — Other Ambulatory Visit: Payer: Self-pay

## 2024-01-12 ENCOUNTER — Other Ambulatory Visit: Payer: Self-pay

## 2024-02-16 ENCOUNTER — Other Ambulatory Visit: Payer: Self-pay

## 2024-03-01 ENCOUNTER — Ambulatory Visit: Payer: Self-pay | Admitting: Family Medicine
